# Patient Record
Sex: Female | Born: 1980 | Race: White | Hispanic: No | Marital: Married | Smoking: Never smoker
Health system: Southern US, Community
[De-identification: ages and names within clinical notes are randomized; demographics above are authoritative.]

## PROBLEM LIST (undated history)

## (undated) DIAGNOSIS — B977 Papillomavirus as the cause of diseases classified elsewhere: Secondary | ICD-10-CM

## (undated) DIAGNOSIS — B009 Herpesviral infection, unspecified: Secondary | ICD-10-CM

## (undated) DIAGNOSIS — F32A Depression, unspecified: Secondary | ICD-10-CM

## (undated) DIAGNOSIS — F419 Anxiety disorder, unspecified: Secondary | ICD-10-CM

## (undated) DIAGNOSIS — F329 Major depressive disorder, single episode, unspecified: Secondary | ICD-10-CM

## (undated) DIAGNOSIS — R87619 Unspecified abnormal cytological findings in specimens from cervix uteri: Secondary | ICD-10-CM

## (undated) HISTORY — DX: Unspecified abnormal cytological findings in specimens from cervix uteri: R87.619

## (undated) HISTORY — DX: Herpesviral infection, unspecified: B00.9

## (undated) HISTORY — DX: Papillomavirus as the cause of diseases classified elsewhere: B97.7

## (undated) HISTORY — PX: KNEE SURGERY: SHX244

## (undated) HISTORY — DX: Anxiety disorder, unspecified: F41.9

## (undated) HISTORY — PX: CERVICAL BIOPSY  W/ LOOP ELECTRODE EXCISION: SUR135

## (undated) HISTORY — DX: Major depressive disorder, single episode, unspecified: F32.9

## (undated) HISTORY — DX: Depression, unspecified: F32.A

---

## 2005-06-20 ENCOUNTER — Emergency Department: Payer: Self-pay | Admitting: Emergency Medicine

## 2011-07-26 ENCOUNTER — Emergency Department: Payer: Self-pay | Admitting: Emergency Medicine

## 2016-10-02 ENCOUNTER — Ambulatory Visit (INDEPENDENT_AMBULATORY_CARE_PROVIDER_SITE_OTHER): Payer: BC Managed Care – PPO | Admitting: Obstetrics and Gynecology

## 2016-10-02 ENCOUNTER — Encounter: Payer: Self-pay | Admitting: Obstetrics and Gynecology

## 2016-10-02 VITALS — BP 138/80 | HR 100 | Ht 68.0 in | Wt 168.8 lb

## 2016-10-02 DIAGNOSIS — Z01419 Encounter for gynecological examination (general) (routine) without abnormal findings: Secondary | ICD-10-CM | POA: Diagnosis not present

## 2016-10-02 DIAGNOSIS — Z87898 Personal history of other specified conditions: Secondary | ICD-10-CM

## 2016-10-02 DIAGNOSIS — E663 Overweight: Secondary | ICD-10-CM | POA: Diagnosis not present

## 2016-10-02 DIAGNOSIS — Z8742 Personal history of other diseases of the female genital tract: Secondary | ICD-10-CM

## 2016-10-02 NOTE — Patient Instructions (Signed)
Health Maintenance, Female Adopting a healthy lifestyle and getting preventive care can go a long way to promote health and wellness. Talk with your health care provider about what schedule of regular examinations is right for you. This is a good chance for you to check in with your provider about disease prevention and staying healthy. In between checkups, there are plenty of things you can do on your own. Experts have done a lot of research about which lifestyle changes and preventive measures are most likely to keep you healthy. Ask your health care provider for more information. Weight and diet Eat a healthy diet  Be sure to include plenty of vegetables, fruits, low-fat dairy products, and lean protein.  Do not eat a lot of foods high in solid fats, added sugars, or salt.  Get regular exercise. This is one of the most important things you can do for your health.  Most adults should exercise for at least 150 minutes each week. The exercise should increase your heart rate and make you sweat (moderate-intensity exercise).  Most adults should also do strengthening exercises at least twice a week. This is in addition to the moderate-intensity exercise. Maintain a healthy weight  Body mass index (BMI) is a measurement that can be used to identify possible weight problems. It estimates body fat based on height and weight. Your health care provider can help determine your BMI and help you achieve or maintain a healthy weight.  For females 35 years of age and older:  A BMI below 18.5 is considered underweight.  A BMI of 18.5 to 24.9 is normal.  A BMI of 25 to 29.9 is considered overweight.  A BMI of 30 and above is considered obese. Watch levels of cholesterol and blood lipids  You should start having your blood tested for lipids and cholesterol at 35 years of age, then have this test every 5 years.  You may need to have your cholesterol levels checked more often if:  Your lipid or  cholesterol levels are high.  You are older than 35 years of age.  You are at high risk for heart disease. Cancer screening Lung Cancer  Lung cancer screening is recommended for adults 64-42 years old who are at high risk for lung cancer because of a history of smoking.  A yearly low-dose CT scan of the lungs is recommended for people who:  Currently smoke.  Have quit within the past 15 years.  Have at least a 30-pack-year history of smoking. A pack year is smoking an average of one pack of cigarettes a day for 1 year.  Yearly screening should continue until it has been 15 years since you quit.  Yearly screening should stop if you develop a health problem that would prevent you from having lung cancer treatment. Breast Cancer  Practice breast self-awareness. This means understanding how your breasts normally appear and feel.  It also means doing regular breast self-exams. Let your health care provider know about any changes, no matter how small.  If you are in your 20s or 30s, you should have a clinical breast exam (CBE) by a health care provider every 1-3 years as part of a regular health exam.  If you are 34 or older, have a CBE every year. Also consider having a breast X-ray (mammogram) every year.  If you have a family history of breast cancer, talk to your health care provider about genetic screening.  If you are at high risk for breast cancer, talk  to your health care provider about having an MRI and a mammogram every year.  Breast cancer gene (BRCA) assessment is recommended for women who have family members with BRCA-related cancers. BRCA-related cancers include:  Breast.  Ovarian.  Tubal.  Peritoneal cancers.  Results of the assessment will determine the need for genetic counseling and BRCA1 and BRCA2 testing. Cervical Cancer  Your health care provider may recommend that you be screened regularly for cancer of the pelvic organs (ovaries, uterus, and vagina).  This screening involves a pelvic examination, including checking for microscopic changes to the surface of your cervix (Pap test). You may be encouraged to have this screening done every 3 years, beginning at age 24.  For women ages 66-65, health care providers may recommend pelvic exams and Pap testing every 3 years, or they may recommend the Pap and pelvic exam, combined with testing for human papilloma virus (HPV), every 5 years. Some types of HPV increase your risk of cervical cancer. Testing for HPV may also be done on women of any age with unclear Pap test results.  Other health care providers may not recommend any screening for nonpregnant women who are considered low risk for pelvic cancer and who do not have symptoms. Ask your health care provider if a screening pelvic exam is right for you.  If you have had past treatment for cervical cancer or a condition that could lead to cancer, you need Pap tests and screening for cancer for at least 20 years after your treatment. If Pap tests have been discontinued, your risk factors (such as having a new sexual partner) need to be reassessed to determine if screening should resume. Some women have medical problems that increase the chance of getting cervical cancer. In these cases, your health care provider may recommend more frequent screening and Pap tests. Colorectal Cancer  This type of cancer can be detected and often prevented.  Routine colorectal cancer screening usually begins at 35 years of age and continues through 35 years of age.  Your health care provider may recommend screening at an earlier age if you have risk factors for colon cancer.  Your health care provider may also recommend using home test kits to check for hidden blood in the stool.  A small camera at the end of a tube can be used to examine your colon directly (sigmoidoscopy or colonoscopy). This is done to check for the earliest forms of colorectal cancer.  Routine  screening usually begins at age 41.  Direct examination of the colon should be repeated every 5-10 years through 35 years of age. However, you may need to be screened more often if early forms of precancerous polyps or small growths are found. Skin Cancer  Check your skin from head to toe regularly.  Tell your health care provider about any new moles or changes in moles, especially if there is a change in a mole's shape or color.  Also tell your health care provider if you have a mole that is larger than the size of a pencil eraser.  Always use sunscreen. Apply sunscreen liberally and repeatedly throughout the day.  Protect yourself by wearing long sleeves, pants, a wide-brimmed hat, and sunglasses whenever you are outside. Heart disease, diabetes, and high blood pressure  High blood pressure causes heart disease and increases the risk of stroke. High blood pressure is more likely to develop in:  People who have blood pressure in the high end of the normal range (130-139/85-89 mm Hg).  People who are overweight or obese.  People who are African American.  If you are 59-24 years of age, have your blood pressure checked every 3-5 years. If you are 34 years of age or older, have your blood pressure checked every year. You should have your blood pressure measured twice-once when you are at a hospital or clinic, and once when you are not at a hospital or clinic. Record the average of the two measurements. To check your blood pressure when you are not at a hospital or clinic, you can use:  An automated blood pressure machine at a pharmacy.  A home blood pressure monitor.  If you are between 29 years and 60 years old, ask your health care provider if you should take aspirin to prevent strokes.  Have regular diabetes screenings. This involves taking a blood sample to check your fasting blood sugar level.  If you are at a normal weight and have a low risk for diabetes, have this test once  every three years after 35 years of age.  If you are overweight and have a high risk for diabetes, consider being tested at a younger age or more often. Preventing infection Hepatitis B  If you have a higher risk for hepatitis B, you should be screened for this virus. You are considered at high risk for hepatitis B if:  You were born in a country where hepatitis B is common. Ask your health care provider which countries are considered high risk.  Your parents were born in a high-risk country, and you have not been immunized against hepatitis B (hepatitis B vaccine).  You have HIV or AIDS.  You use needles to inject street drugs.  You live with someone who has hepatitis B.  You have had sex with someone who has hepatitis B.  You get hemodialysis treatment.  You take certain medicines for conditions, including cancer, organ transplantation, and autoimmune conditions. Hepatitis C  Blood testing is recommended for:  Everyone born from [birth year] through 1964.  Anyone with known risk factors for hepatitis C. Sexually transmitted infections (STIs)  You should be screened for sexually transmitted infections (STIs) including gonorrhea and chlamydia if:  You are sexually active and are younger than 35 years of age.  You are older than 35 years of age and your health care provider tells you that you are at risk for this type of infection.  Your sexual activity has changed since you were last screened and you are at an increased risk for chlamydia or gonorrhea. Ask your health care provider if you are at risk.  If you do not have HIV, but are at risk, it may be recommended that you take a prescription medicine daily to prevent HIV infection. This is called pre-exposure prophylaxis (PrEP). You are considered at risk if:  You are sexually active and do not regularly use condoms or know the HIV status of your partner(s).  You take drugs by injection.  You are sexually active with a partner  who has HIV. Talk with your health care provider about whether you are at high risk of being infected with HIV. If you choose to begin PrEP, you should first be tested for HIV. You should then be tested every 3 months for as long as you are taking PrEP. Pregnancy  If you are premenopausal and you may become pregnant, ask your health care provider about preconception counseling.  If you may become pregnant, take 400 to 800 micrograms (mcg) of folic acid  every day.  If you want to prevent pregnancy, talk to your health care provider about birth control (contraception). Osteoporosis and menopause  Osteoporosis is a disease in which the bones lose minerals and strength with aging. This can result in serious bone fractures. Your risk for osteoporosis can be identified using a bone density scan.  If you are 4 years of age or older, or if you are at risk for osteoporosis and fractures, ask your health care provider if you should be screened.  Ask your health care provider whether you should take a calcium or vitamin D supplement to lower your risk for osteoporosis.  Menopause may have certain physical symptoms and risks.  Hormone replacement therapy may reduce some of these symptoms and risks. Talk to your health care provider about whether hormone replacement therapy is right for you. Follow these instructions at home:  Schedule regular health, dental, and eye exams.  Stay current with your immunizations.  Do not use any tobacco products including cigarettes, chewing tobacco, or electronic cigarettes.  If you are pregnant, do not drink alcohol.  If you are breastfeeding, limit how much and how often you drink alcohol.  Limit alcohol intake to no more than 1 drink per day for nonpregnant women. One drink equals 12 ounces of beer, 5 ounces of wine, or 1 ounces of hard liquor.  Do not use street drugs.  Do not share needles.  Ask your health care provider for help if you need support  or information about quitting drugs.  Tell your health care provider if you often feel depressed.  Tell your health care provider if you have ever been abused or do not feel safe at home. This information is not intended to replace advice given to you by your health care provider. Make sure you discuss any questions you have with your health care provider. Document Released: 01/27/2011 Document Revised: 12/20/2015 Document Reviewed: 04/17/2015 Elsevier Interactive Patient Education  2017 [employer].
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
## 2016-10-03 LAB — COMPREHENSIVE METABOLIC PANEL
ALBUMIN: 4.3 g/dL (ref 3.5–5.5)
ALT: 15 IU/L (ref 0–32)
AST: 14 IU/L (ref 0–40)
Albumin/Globulin Ratio: 2 (ref 1.2–2.2)
Alkaline Phosphatase: 40 IU/L (ref 39–117)
BUN/Creatinine Ratio: 21 (ref 9–23)
BUN: 15 mg/dL (ref 6–20)
Bilirubin Total: 0.3 mg/dL (ref 0.0–1.2)
CALCIUM: 9.1 mg/dL (ref 8.7–10.2)
CO2: 24 mmol/L (ref 18–29)
CREATININE: 0.7 mg/dL (ref 0.57–1.00)
Chloride: 104 mmol/L (ref 96–106)
GFR, EST AFRICAN AMERICAN: 131 mL/min/{1.73_m2} (ref >59)
GFR, EST NON AFRICAN AMERICAN: 113 mL/min/{1.73_m2} (ref >59)
GLOBULIN, TOTAL: 2.2 g/dL (ref 1.5–4.5)
Glucose: 108 mg/dL — ABNORMAL HIGH (ref 65–99)
Potassium: 3.9 mmol/L (ref 3.5–5.2)
SODIUM: 140 mmol/L (ref 134–144)
TOTAL PROTEIN: 6.5 g/dL (ref 6.0–8.5)

## 2016-10-03 LAB — CBC
HEMATOCRIT: 42.1 % (ref 34.0–46.6)
HEMOGLOBIN: 14.2 g/dL (ref 11.1–15.9)
MCH: 30.7 pg (ref 26.6–33.0)
MCHC: 33.7 g/dL (ref 31.5–35.7)
MCV: 91 fL (ref 79–97)
Platelets: 247 10*3/uL (ref 150–379)
RBC: 4.62 x10E6/uL (ref 3.77–5.28)
RDW: 12.6 % (ref 12.3–15.4)
WBC: 8.5 10*3/uL (ref 3.4–10.8)

## 2016-10-06 LAB — PAP IG AND HPV HIGH-RISK
HPV, HIGH-RISK: NEGATIVE
PAP Smear Comment: 0

## 2017-10-06 ENCOUNTER — Ambulatory Visit (INDEPENDENT_AMBULATORY_CARE_PROVIDER_SITE_OTHER): Payer: BC Managed Care – PPO | Admitting: Obstetrics and Gynecology

## 2017-10-06 ENCOUNTER — Other Ambulatory Visit (INDEPENDENT_AMBULATORY_CARE_PROVIDER_SITE_OTHER): Payer: BC Managed Care – PPO

## 2017-10-06 ENCOUNTER — Encounter: Payer: Self-pay | Admitting: Obstetrics and Gynecology

## 2017-10-06 VITALS — BP 110/78 | HR 91 | Ht 68.0 in | Wt 179.9 lb

## 2017-10-06 DIAGNOSIS — N979 Female infertility, unspecified: Secondary | ICD-10-CM | POA: Diagnosis not present

## 2017-10-06 DIAGNOSIS — E663 Overweight: Secondary | ICD-10-CM | POA: Diagnosis not present

## 2017-10-06 DIAGNOSIS — Z01419 Encounter for gynecological examination (general) (routine) without abnormal findings: Secondary | ICD-10-CM | POA: Diagnosis not present

## 2017-10-06 NOTE — Patient Instructions (Addendum)
Health Maintenance, Female Adopting a healthy lifestyle and getting preventive care can go a long way to promote health and wellness. Talk with your health care provider about what schedule of regular examinations is right for you. This is a good chance for you to check in with your provider about disease prevention and staying healthy. In between checkups, there are plenty of things you can do on your own. Experts have done a lot of research about which lifestyle changes and preventive measures are most likely to keep you healthy. Ask your health care provider for more information. Weight and diet Eat a healthy diet  Be sure to include plenty of vegetables, fruits, low-fat dairy products, and lean protein.  Do not eat a lot of foods high in solid fats, added sugars, or salt.  Get regular exercise. This is one of the most important things you can do for your health. ? Most adults should exercise for at least 150 minutes each week. The exercise should increase your heart rate and make you sweat (moderate-intensity exercise). ? Most adults should also do strengthening exercises at least twice a week. This is in addition to the moderate-intensity exercise.  Maintain a healthy weight  Body mass index (BMI) is a measurement that can be used to identify possible weight problems. It estimates body fat based on height and weight. Your health care provider can help determine your BMI and help you achieve or maintain a healthy weight.  For females 20 years of age and older: ? A BMI below 18.5 is considered underweight. ? A BMI of 18.5 to 24.9 is normal. ? A BMI of 25 to 29.9 is considered overweight. ? A BMI of 30 and above is considered obese.  Watch levels of cholesterol and blood lipids  You should start having your blood tested for lipids and cholesterol at 36 years of age, then have this test every 5 years.  You may need to have your cholesterol levels checked more often if: ? Your lipid or  cholesterol levels are high. ? You are older than 36 years of age. ? You are at high risk for heart disease.  Cancer screening Lung Cancer  Lung cancer screening is recommended for adults 55-80 years old who are at high risk for lung cancer because of a history of smoking.  A yearly low-dose CT scan of the lungs is recommended for people who: ? Currently smoke. ? Have quit within the past 15 years. ? Have at least a 30-pack-year history of smoking. A pack year is smoking an average of one pack of cigarettes a day for 1 year.  Yearly screening should continue until it has been 15 years since you quit.  Yearly screening should stop if you develop a health problem that would prevent you from having lung cancer treatment.  Breast Cancer  Practice breast self-awareness. This means understanding how your breasts normally appear and feel.  It also means doing regular breast self-exams. Let your health care provider know about any changes, no matter how small.  If you are in your 20s or 30s, you should have a clinical breast exam (CBE) by a health care provider every 1-3 years as part of a regular health exam.  If you are 40 or older, have a CBE every year. Also consider having a breast X-ray (mammogram) every year.  If you have a family history of breast cancer, talk to your health care provider about genetic screening.  If you are at high risk   for breast cancer, talk to your health care provider about having an MRI and a mammogram every year.  Breast cancer gene (BRCA) assessment is recommended for women who have family members with BRCA-related cancers. BRCA-related cancers include: ? Breast. ? Ovarian. ? Tubal. ? Peritoneal cancers.  Results of the assessment will determine the need for genetic counseling and BRCA1 and BRCA2 testing.  Cervical Cancer Your health care provider may recommend that you be screened regularly for cancer of the pelvic organs (ovaries, uterus, and  vagina). This screening involves a pelvic examination, including checking for microscopic changes to the surface of your cervix (Pap test). You may be encouraged to have this screening done every 3 years, beginning at age 22.  For women ages 56-65, health care providers may recommend pelvic exams and Pap testing every 3 years, or they may recommend the Pap and pelvic exam, combined with testing for human papilloma virus (HPV), every 5 years. Some types of HPV increase your risk of cervical cancer. Testing for HPV may also be done on women of any age with unclear Pap test results.  Other health care providers may not recommend any screening for nonpregnant women who are considered low risk for pelvic cancer and who do not have symptoms. Ask your health care provider if a screening pelvic exam is right for you.  If you have had past treatment for cervical cancer or a condition that could lead to cancer, you need Pap tests and screening for cancer for at least 20 years after your treatment. If Pap tests have been discontinued, your risk factors (such as having a new sexual partner) need to be reassessed to determine if screening should resume. Some women have medical problems that increase the chance of getting cervical cancer. In these cases, your health care provider may recommend more frequent screening and Pap tests.  Colorectal Cancer  This type of cancer can be detected and often prevented.  Routine colorectal cancer screening usually begins at 36 years of age and continues through 36 years of age.  Your health care provider may recommend screening at an earlier age if you have risk factors for colon cancer.  Your health care provider may also recommend using home test kits to check for hidden blood in the stool.  A small camera at the end of a tube can be used to examine your colon directly (sigmoidoscopy or colonoscopy). This is done to check for the earliest forms of colorectal  cancer.  Routine screening usually begins at age 33.  Direct examination of the colon should be repeated every 5-10 years through 36 years of age. However, you may need to be screened more often if early forms of precancerous polyps or small growths are found.  Skin Cancer  Check your skin from head to toe regularly.  Tell your health care provider about any new moles or changes in moles, especially if there is a change in a mole's shape or color.  Also tell your health care provider if you have a mole that is larger than the size of a pencil eraser.  Always use sunscreen. Apply sunscreen liberally and repeatedly throughout the day.  Protect yourself by wearing long sleeves, pants, a wide-brimmed hat, and sunglasses whenever you are outside.  Heart disease, diabetes, and high blood pressure  High blood pressure causes heart disease and increases the risk of stroke. High blood pressure is more likely to develop in: ? People who have blood pressure in the high end of  the normal range (130-139/85-89 mm Hg). ? People who are overweight or obese. ? People who are African American.  If you are 21-29 years of age, have your blood pressure checked every 3-5 years. If you are 3 years of age or older, have your blood pressure checked every year. You should have your blood pressure measured twice-once when you are at a hospital or clinic, and once when you are not at a hospital or clinic. Record the average of the two measurements. To check your blood pressure when you are not at a hospital or clinic, you can use: ? An automated blood pressure machine at a pharmacy. ? A home blood pressure monitor.  If you are between 17 years and 37 years old, ask your health care provider if you should take aspirin to prevent strokes.  Have regular diabetes screenings. This involves taking a blood sample to check your fasting blood sugar level. ? If you are at a normal weight and have a low risk for diabetes,  have this test once every three years after 36 years of age. ? If you are overweight and have a high risk for diabetes, consider being tested at a younger age or more often. Preventing infection Hepatitis B  If you have a higher risk for hepatitis B, you should be screened for this virus. You are considered at high risk for hepatitis B if: ? You were born in a country where hepatitis B is common. Ask your health care provider which countries are considered high risk. ? Your parents were born in a high-risk country, and you have not been immunized against hepatitis B (hepatitis B vaccine). ? You have HIV or AIDS. ? You use needles to inject street drugs. ? You live with someone who has hepatitis B. ? You have had sex with someone who has hepatitis B. ? You get hemodialysis treatment. ? You take certain medicines for conditions, including cancer, organ transplantation, and autoimmune conditions.  Hepatitis C  Blood testing is recommended for: ? Everyone born from [birth year] through 1964. ? Anyone with known risk factors for hepatitis C.  Sexually transmitted infections (STIs)  You should be screened for sexually transmitted infections (STIs) including gonorrhea and chlamydia if: ? You are sexually active and are younger than 36 years of age. ? You are older than 36 years of age and your health care provider tells you that you are at risk for this type of infection. ? Your sexual activity has changed since you were last screened and you are at an increased risk for chlamydia or gonorrhea. Ask your health care provider if you are at risk.  If you do not have HIV, but are at risk, it may be recommended that you take a prescription medicine daily to prevent HIV infection. This is called pre-exposure prophylaxis (PrEP). You are considered at risk if: ? You are sexually active and do not regularly use condoms or know the HIV status of your partner(s). ? You take drugs by injection. ? You are  sexually active with a partner who has HIV.  Talk with your health care provider about whether you are at high risk of being infected with HIV. If you choose to begin PrEP, you should first be tested for HIV. You should then be tested every 3 months for as long as you are taking PrEP. Pregnancy  If you are premenopausal and you may become pregnant, ask your health care provider about preconception counseling.  If you may become  pregnant, take 400 to 800 micrograms (mcg) of folic acid every day.  If you want to prevent pregnancy, talk to your health care provider about birth control (contraception). Osteoporosis and menopause  Osteoporosis is a disease in which the bones lose minerals and strength with aging. This can result in serious bone fractures. Your risk for osteoporosis can be identified using a bone density scan.  If you are 60 years of age or older, or if you are at risk for osteoporosis and fractures, ask your health care provider if you should be screened.  Ask your health care provider whether you should take a calcium or vitamin D supplement to lower your risk for osteoporosis.  Menopause may have certain physical symptoms and risks.  Hormone replacement therapy may reduce some of these symptoms and risks. Talk to your health care provider about whether hormone replacement therapy is right for you. Follow these instructions at home:  Schedule regular health, dental, and eye exams.  Stay current with your immunizations.  Do not use any tobacco products including cigarettes, chewing tobacco, or electronic cigarettes.  If you are pregnant, do not drink alcohol.  If you are breastfeeding, limit how much and how often you drink alcohol.  Limit alcohol intake to no more than 1 drink per day for nonpregnant women. One drink equals 12 ounces of beer, 5 ounces of wine, or 1 ounces of hard liquor.  Do not use street drugs.  Do not share needles.  Ask your health care  provider for help if you need support or information about quitting drugs.  Tell your health care provider if you often feel depressed.  Tell your health care provider if you have ever been abused or do not feel safe at home. This information is not intended to replace advice given to you by your health care provider. Make sure you discuss any questions you have with your health care provider. Document Released: 01/27/2011 Document Revised: 12/20/2015 Document Reviewed: 04/17/2015 Elsevier Interactive Patient Education  [employer].    Infertility Infertility is when you are unable to get pregnant (conceive) after a year of having sex regularly without using birth control. Infertility can also mean that a woman is not able to carry a pregnancy to full term. Both women and men can have fertility problems. What causes infertility? What Causes Infertility in Women? There are many possible causes of infertility in women. For some women, the cause of infertility is not known (unexplained infertility). Infertility can also be linked to more than one cause. Infertility problems in women can be caused by problems with the menstrual cycle or reproductive organs, certain medical conditions, and factors related to lifestyle and age.  Problems with your menstrual cycle can interfere with your ovaries producing eggs (ovulation). This can make it difficult to get pregnant. This includes having a menstrual cycle that is very long, very short, or irregular.  Problems with reproductive organs can include: ? An abnormally narrow cervix or a cervix that does not remain closed during a pregnancy. ? A blockage in your fallopian tubes. ? An abnormally shaped uterus. ? Uterine fibroids. This is a tissue mass (tumor) that can develop on your uterus.  Medical conditions that can affect a woman's fertility include: ? Polycystic ovarian syndrome (PCOS). This is a hormonal disorder that can cause small cysts  to grow on your ovaries. This is the most common cause of infertility in women. ? Endometriosis. This is a condition in which the tissue that  lines your uterus (endometrium) grows outside of its normal location. ? Primary ovary insufficiency. This is when your ovaries stop producing eggs and hormones before the age of 32. ? Sexually transmitted diseases, such as chlamydia or gonorrhea. These infections can cause scarring in your fallopian tubes. This makes it difficult for eggs to reach your uterus. ? Autoimmune disorders. These are disorders in which your immune system attacks normal, healthy cells. ? Hormone imbalances.  Other factors include: ? Age. A woman's fertility declines with age, especially after her mid-82s. ? Being under- or overweight. ? Drinking too much alcohol. ? Using drugs. ? Exercising excessively. ? Being exposed to environmental toxins, such as radiation, pesticides, and certain chemicals.  What Causes Infertility in Men? There are many causes of infertility in men. Infertility can be linked to more than one cause. Infertility problems in men can be caused by problems with sperm or the reproductive organs, certain medical conditions, and factors related to lifestyle and age. Some men have unexplained infertility.  Problems with sperm. Infertility can result if there is a problem producing: ? Enough sperm (low sperm count). ? Enough normally-shaped sperm (sperm morphology). ? Sperm that are able to reach the egg (poor motility).  Infertility can also be caused by: ? A problem with hormones. ? Enlarged veins (varicoceles), cysts (spermatoceles), or tumors of the testicles. ? Sexual dysfunction. ? Injury to the testicles. ? A birth defect, such as not having the tubes that carry sperm (vas deferens).  Medical conditions that can affect a man's fertility include: ? Diabetes. ? Cancer treatments, such as chemotherapy or radiation. ? Klinefelter syndrome. This is an  inherited genetic disorder. ? Thyroid problems, such as an under- or overactive thyroid. ? Cystic fibrosis. ? Sexually transmitted diseases.  Other factors include: ? Age. A man's fertility declines with age. ? Drinking too much alcohol. ? Using drugs. ? Being exposed to environmental toxins, such as pesticides and lead.  What are the symptoms of infertility? Being unable to get pregnant after one year of having regular sex without using birth control is the only sign of infertility. How is infertility diagnosed? In order to be diagnosed with infertility, both partners will have a physical exam. Both partners will also have an extensive medical and sexual history taken. If there is no obvious reason for infertility, additional tests may be done. What Tests Will Women Have? Women may first have tests to check whether they are ovulating each month. The tests may include:  Blood tests to check hormone levels.  An ultrasound of the ovaries. This looks for possible problems on or in the ovaries.  Taking a small sample of the tissue that lines the uterus for examination under a microscope (endometrial biopsy).  Women who are ovulating may have additional tests. These may include:  Hysterosalpingography. ? This is an X-ray of the fallopian tubes and uterus taken after a specific type of dye is injected. ? This test can show the shape of the uterus and whether the fallopian tubes are open.  Laparoscopy. ? In this test, a lighted tube (laparoscope) is used to look for problems in the fallopian tubes and other female organs.  Transvaginal ultrasound. ? This is an imaging test to check for abnormalities of the uterus and ovaries. ? A health care provider can use this test to count the number of follicles on the ovaries.  Hysteroscopy. ? This test involves using a lighted tube to examine the cervix and inside the uterus. ? It  is done to find any abnormalities inside the uterus.  What  Tests Will Men Have? Tests for men's infertility includes:  Semen tests to check sperm count, morphology, and motility.  Blood tests to check for hormone levels.  Taking a small sample of tissue from inside a testicle (biopsy). This is examined under a microscope.  Blood tests to check for genetic abnormalities (genetic testing).  How are women treated for infertility? Treatment depends on the cause of infertility. Most cases of infertility in women are treated with medicine or surgery.  Women may take medicine to: ? Correct ovulation problems. ? Treat other health conditions, such as PCOS.  Surgery may be done to: ? Repair damage to the ovaries, fallopian tubes, cervix, or uterus. ? Remove growths from the uterus. ? Remove scar tissue from the uterus, pelvis, or other female organs.  How are men treated for infertility? Treatment depends on the cause of infertility. Most cases of infertility in men are treated with medicine or surgery.  Men may take medicine to: ? Correct hormone problems. ? Treat other health conditions. ? Treat sexual dysfunction.  Surgery may be done to: ? Remove blockages in the reproductive tract. ? Correct other structural problems of the reproductive tract.  What is assisted reproductive technology? Assisted reproductive technology (ART) refers to all treatments and procedures that combine eggs and sperm outside the body to try to help a couple conceive. ART is often combined with fertility drugs to stimulate ovulation. Sometimes ART is done using eggs retrieved from another woman's body (donor eggs) or from previously frozen fertilized eggs (embryos). There are different types of ART. These include:  Intrauterine insemination (IUI). ? In this procedure, sperm is placed directly into a woman's uterus with a long, thin tube. ? This may be most effective for infertility caused by sperm problems, including low sperm count and low motility. ? Can be used  in combination with fertility drugs.  In vitro fertilization (IVF). ? This is often done when a woman's fallopian tubes are blocked or when a man has low sperm counts. ? Fertility drugs stimulate the ovaries to produce multiple eggs. Once mature, these eggs are removed from the body and combined with the sperm to be fertilized. ? These fertilized eggs are then placed in the woman's uterus.  This information is not intended to replace advice given to you by your health care provider. Make sure you discuss any questions you have with your health care provider. Document Released: 07/17/2003 Document Revised: 12/14/2015 Document Reviewed: 03/29/2014 Elsevier Interactive Patient Education  2018 [employer].

## 2017-10-06 NOTE — Progress Notes (Signed)
Pt is doing well. Pt is trying to get pregnancy since April 2018.
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
## 2017-10-07 LAB — COMPREHENSIVE METABOLIC PANEL
ALT: 28 IU/L (ref 0–32)
AST: 19 IU/L (ref 0–40)
Albumin/Globulin Ratio: 2 (ref 1.2–2.2)
Albumin: 4.7 g/dL (ref 3.5–5.5)
Alkaline Phosphatase: 58 IU/L (ref 39–117)
BUN/Creatinine Ratio: 14 (ref 9–23)
BUN: 11 mg/dL (ref 6–20)
Bilirubin Total: 0.3 mg/dL (ref 0.0–1.2)
CALCIUM: 9.4 mg/dL (ref 8.7–10.2)
CO2: 19 mmol/L — ABNORMAL LOW (ref 20–29)
CREATININE: 0.78 mg/dL (ref 0.57–1.00)
Chloride: 102 mmol/L (ref 96–106)
GFR calc Af Amer: 114 mL/min/{1.73_m2} (ref >59)
GFR, EST NON AFRICAN AMERICAN: 99 mL/min/{1.73_m2} (ref >59)
GLOBULIN, TOTAL: 2.3 g/dL (ref 1.5–4.5)
GLUCOSE: 100 mg/dL — AB (ref 65–99)
Potassium: 4.5 mmol/L (ref 3.5–5.2)
SODIUM: 140 mmol/L (ref 134–144)
Total Protein: 7 g/dL (ref 6.0–8.5)

## 2017-10-07 LAB — CBC
HEMATOCRIT: 44.1 % (ref 34.0–46.6)
HEMOGLOBIN: 14.8 g/dL (ref 11.1–15.9)
MCH: 30.6 pg (ref 26.6–33.0)
MCHC: 33.6 g/dL (ref 31.5–35.7)
MCV: 91 fL (ref 79–97)
Platelets: 254 10*3/uL (ref 150–379)
RBC: 4.84 x10E6/uL (ref 3.77–5.28)
RDW: 13 % (ref 12.3–15.4)
WBC: 5.2 10*3/uL (ref 3.4–10.8)

## 2017-10-07 LAB — TSH: TSH: 2.36 u[IU]/mL (ref 0.450–4.500)

## 2017-10-10 LAB — PROGESTERONE: Progesterone: 8.4 ng/mL

## 2017-10-10 LAB — ESTRADIOL: ESTRADIOL: 140.5 pg/mL

## 2017-10-10 LAB — FSH/LH
FSH: 5 m[IU]/mL
LH: 10.9 m[IU]/mL

## 2017-10-10 LAB — ANTI MULLERIAN HORMONE: ANTI-MULLERIAN HORMONE (AMH): 4.08 ng/mL

## 2017-10-19 ENCOUNTER — Encounter: Payer: Self-pay | Admitting: Obstetrics and Gynecology

## 2017-10-19 ENCOUNTER — Ambulatory Visit (INDEPENDENT_AMBULATORY_CARE_PROVIDER_SITE_OTHER): Payer: BC Managed Care – PPO | Admitting: Obstetrics and Gynecology

## 2017-10-19 VITALS — BP 101/67 | HR 83 | Ht 68.0 in | Wt 181.8 lb

## 2017-10-19 DIAGNOSIS — N979 Female infertility, unspecified: Secondary | ICD-10-CM | POA: Diagnosis not present

## 2017-10-19 MED ORDER — LETROZOLE 2.5 MG PO TABS: 2.5000 mg | ORAL_TABLET | Freq: Every day | ORAL | 0 refills | Status: DC

## 2017-10-19 NOTE — Progress Notes (Signed)
GYNECOLOGY PROGRESS NOTE  Subjective:    Patient ID: Melanie Stevenson, female    DOB: [birth year], 37 y.o.   MRN: [id number]  HPI  Patient is a 37 y.o. G0P0000 female who presents for f/u of infertility evaluation.  Denies complaints today.  Patient's last menstrual period was 10/14/2017.   The following portions of the patient's history were reviewed and updated as appropriate: allergies, current medications, past family history, past medical history, past social history, past surgical history and problem list.  Review of Systems Pertinent items noted in HPI and remainder of comprehensive ROS otherwise negative.   Objective:   Blood pressure 101/67, pulse 83, height 5\' 8"  (1.727 m), weight 181 lb 12.8 oz (82.5 kg), last menstrual period 10/14/2017. General appearance: alert and no distress Remainder of exam deferred.    Labs:  Results for orders placed or performed in visit on 10/06/17  TSH  Result Value Ref Range   TSH 2.360 0.450 - 4.500 uIU/mL  Anti mullerian hormone  Result Value Ref Range   ANTI-MULLERIAN HORMONE (AMH) 4.08 ng/mL  Progesterone  Result Value Ref Range   Progesterone 8.4 ng/mL  Estradiol  Result Value Ref Range   Estradiol 140.5 pg/mL  FSH/LH  Result Value Ref Range   LH 10.9 mIU/mL   FSH 5.0 mIU/mL     Imaging:  ULTRASOUND REPORT  Location: [facility] Date of Service:  10/06/2017   Indications: Evaluate infertility Findings:  The uterus measures 9.5 x 5.9 x 5.0 cm. Echo texture is homogeneous without evidence of focal masses. The Endometrium measures 10.9 mm. A very small anechoic area is noted within the fundus of the endometrium.  ? Very early pregnancy vs fluid vs other etiology.  Right Ovary measures 2.9 x 2.4 x 1.5 cm. It is normal in appearance. Left Ovary measures 3.8 x 2.9 x 2.4 cm. It appears to contain a degenerating corpus luteal cyst. Survey of the adnexa demonstrates no adnexal masses. There is no free fluid  in the cul de sac.  Impression: 1. Anteverted uterus appears of normal size and contour. 2. The endometrium measures 10.9 mm. 3. Bilateral ovaries appear WNL. 4. Left ovary appears to contain a possible degenerating corpus luteal cyst. 5. Very small anechoic area noted within the fundus of the endometrium.  ? Very early pregnancy vs fluid vs other etiology.  Recommendations: 1.Clinical correlation with the patient's History and Physical Exam.   Kari Baars, RDMS   Assessment:   Infertility, primary  Plan:   - Discussion had regarding patient's labs and ultrasound. Possible early pregnancy ruled out as patient did go on to have a menstrual cycle this month.  Currently she has good ovarian reserve for her age, and normal estradiol and progesterone levels for current phase in cycle.  Ultrasound overall shows no structural causes for infertility (including septum, fibroids, or mullerian anomalies).  She has no prior history of pelvic infections or surgery, so HSG is not warranted at this time. Discussed use of ovulation induction medications to increase number of released eggs at ovulation time to increase chances of fertilization.   She was given a Rx for Femara 2.5 mg po qd to take on days 5-9 of her cycle.  The risks of Femara including ovarian hyperstimulation with possible risk of ovarian cancer as well as multiple gestation were discussed with patient.  Patient also advised to continue timed intercourse especially around Day 14 of her upcoming cycle (qod intercourse around days 9 - 18).  By Day 21 she can have a progesterone lab test.  If patient has bleeding at end of third cycle, will increase Femara by 2.5 mg for the following cycle; she can have a total of 6 cycles.  However if patient does not have bleeding, she was told to do a pregnancy test/come in for evaluation. - Also recommend partner evaluation with semen analysis. Given information and instructions to patient regarding  sample submission to give to partner.  - Will have patient f/u in 3 months for re-evaluation.    A total of 15 minutes were spent face-to-face with the patient during this encounter and over half of that time dealt with counseling and coordination of care.  Hildred Laserherry, Demetra Moya, MD [facility]

## 2017-10-19 NOTE — Patient Instructions (Signed)
FEMARA PATIENT INSTRUCTIONS  WHY USE IT? FEMARA helps your ovaries to release eggs (ovulate).  HOW TO USE IT? Femara is taken as a pill usually on days 5,6,7,8, & 9 of your cycle.  Day 1 is the first day of your period. The dose or duration may be changed to achieve ovulation.  Provera (progesterone) may first be used to bring on a period for some patients.  If you do not get pregnant this cycle, for your next cycles, take on days 1, 2, 3, 4 and 5.  If you do not get a period, take Provera 10 mg daily for 10 days to bring on a period; the first day you get bleeding is Day 1 of your cycle. The day of ovulation on Clomid is usually between cycle day 14 and 17.  Having sexual intercourse at least every other day between cycle day 13 and 18 will improve your chances of becoming pregnant during the Clomid cycle.  You may monitor your ovulation using basal body temperature charts or with ovulation kits.  If using the ovulation predictor kits, having intercourse the day of the surge and the two days following is recommended. If you get your period, call when it starts for an appointment with your doctor, so that an exam may be done, and another Clomid cycle can be considered if appropriate. If you do not get a period by day 35 of the cycle, please get a blood pregnancy test.  If it is negative, speak to your doctor for instructions to bring on another period and to plan a follow-up appointment.  THINGS TO KNOW: If you get pregnant while using Femara, your chance of twins is 7% and triplets is less than 1%. Some studies have suggested the use of "fertility drugs" may increase your risk of ovarian cancers in the future.  It is unclear if these drugs increase the risk, or people who have problems with fertility are prone for these cancers.  If there is an actual risk, it is very low.  If you have a history of liver problems or ovarian cancer, it may be wise to avoid this medication.  SIDE EFFECTS:  The most  common side effect is hot flashes (20%).  Breast tenderness, headaches, nausea, bloating may also occur at different times.  Less than 3/1,000 people have dryness or loss of hair.  Persistent ovarian cysts may form from the use of this medication.  Ovarian hyperstimulation syndrome is a rare side effect at low doses.  Visual changes like flashes of light or blurring.      Pregnancy After Age 36 Women who become pregnant after the age of 36 have a higher risk for certain problems during pregnancy. This is because older women may already have health problems before becoming pregnant. Older women who are healthy before pregnancy may still develop problems during pregnancy. These problems may affect the mother, the unborn baby (fetus), or both. What are the risks for me? If you are over age 36 and you want to become pregnant or are pregnant, you may have a higher risk of:  Not being able to get pregnant (infertility).  Going into labor early (preterm labor).  Needing surgical delivery of your baby (cesarean delivery, or C-section).  Having high blood pressure (hypertension).  Having complications during pregnancy, such as high blood pressure and other symptoms (preeclampsia).  Having diabetes during pregnancy (gestational diabetes).  Being pregnant with more than one baby.  Loss of the unborn baby before 6420  weeks (miscarriage) or after 20 weeks of pregnancy (stillbirth).  What are the risks for my baby? Babies born to women over the age of 4 have a higher risk for:  Being born early (prematurity).  Low birth weight, which is less than 5 lb, 8 oz (2.5 kg).  Birth defects, such as Down syndrome and cleft palate.  Health complications, including problems with growth and development.  How is prenatal care different for women over age 49? All women should see their health care provider before they try to become pregnant. This is especially important for women over the age of  75. Tell your health care provider about:  Any health problems you have.  Any medicines you take.  Any family history of health problems or chromosome-related defects.  Any problems you have had with past pregnancies or deliveries.  If you are over age 32 and you plan to become pregnant:  Start taking a daily multivitamin a month or more before you try to get pregnant. Your multivitamin should contain 400 mcg (micrograms) of folic acid.  If you are over age 17 and pregnant, make sure you:  Keep taking your multivitamin unless your health care provider tells you not to take it.  Keep all prenatal visits as told by your health care provider. This is important.  Have ultrasounds regularly throughout your pregnancy to check for problems.  Talk with your health care provider about other prenatal screening tests that you may need.  What additional prenatal tests are needed? Screening tests show whether your baby has a higher risk for birth defects than other babies. Screening tests include:  Ultrasound tests to look for markers that indicate a risk for birth defects.  Maternal blood screening. These are blood tests that measure certain substances in your blood to determine your baby's risk for defects.  Screening tests do not show whether your baby has or does not have defects. They only show your baby's risk for certain defects. If your screening tests show that risk factors are present, you may need tests to confirm the defect (diagnostic testing). These tests may include:  Chorionic villus sampling. For this procedure, a tissue sample is taken from the organ that forms in your uterus to nourish your baby (placenta). The sample is removed through your cervix or abdomen and tested.  Amniocentesis. For this procedure, a small amount of the fluid that surrounds the baby in the uterus (amniotic fluid) is removed and tested.  What can I do to stay healthy during my pregnancy? Staying  healthy during pregnancy can help you and your baby to have a lower risk for problems during pregnancy, during delivery, or both. Talk with your health care provider for specific instructions about staying healthy during your pregnancy. Nutrition  At each meal, eat a variety of foods from each of the five food groups. These groups include: ? Proteins such as lean meats, poultry, fish that is low in fat, beans, eggs, and nuts. ? Vegetables such as leafy greens, raw and cooked vegetables, and vegetable juice. ? Fruits that are fresh, frozen, or canned, or 100% fruit juice. ? Dairy products such as low-fat yogurt, cheese, and milk. ? Whole grains including rice, cereal, pasta, and bread.  Talk with your health care provider about how much food in each group is right for you.  Follow instructions from your health care provider about eating and drinking restrictions during pregnancy. ? Do not eat raw eggs, raw meat, or raw fish or seafood. ?  Do not eat any fish that contains high amounts of mercury, such as swordfish or mackerel.  Drink 6-8 or more glasses of water a day. You should drink enough fluid to keep your urine pale yellow. Managing weight gain  Ask your health care provider how much weight gain is healthy during pregnancy.  Stay at a healthy weight. If needed, work with your health care provider to lose weight safely. Activity  Exercise regularly, as directed by your health care provider. Ask your health care provider what forms of exercise are safe for you. General instructions  Do not use any products that contain nicotine or tobacco, such as cigarettes and e-cigarettes. If you need help quitting, ask your health care provider.  Do not drink alcohol, use drugs, or abuse prescription medicine.  Take over-the-counter and prescription medicines only as told by your health care provider.  Do not use hot tubs, steam rooms, or saunas.  Talk with your health care provider about  your risk of exposure to harmful environmental conditions. This includes exposure to chemicals, radiation, cleaning products, and cat feces. Follow advice from your health care provider about how to limit your exposure. Summary  Women who become pregnant after the age of 48 have a higher risk for complications during pregnancy.  Problems may affect the mother, the unborn baby (fetus), or both.  All women should see their health care provider before they try to become pregnant. This is especially important for women over the age of 31.  Staying healthy during pregnancy can help both you and your baby to have a lower risk for some of the problems that can happen during pregnancy, during delivery, or both. This information is not intended to replace advice given to you by your health care provider. Make sure you discuss any questions you have with your health care provider. Document Released: 11/03/2016 Document Revised: 11/03/2016 Document Reviewed: 11/03/2016 Elsevier Interactive Patient Education  2018 [employer].

## 2017-10-19 NOTE — Progress Notes (Signed)
Pt is doing well.

## 2017-10-21 ENCOUNTER — Encounter: Payer: BC Managed Care – PPO | Admitting: Obstetrics and Gynecology

## 2017-10-22 ENCOUNTER — Encounter: Payer: Self-pay | Admitting: Obstetrics and Gynecology

## 2017-11-04 ENCOUNTER — Other Ambulatory Visit: Payer: BC Managed Care – PPO

## 2017-11-04 ENCOUNTER — Other Ambulatory Visit: Payer: Self-pay | Admitting: Obstetrics and Gynecology

## 2017-11-04 ENCOUNTER — Other Ambulatory Visit: Payer: Self-pay

## 2017-11-04 ENCOUNTER — Encounter: Payer: Self-pay | Admitting: Obstetrics and Gynecology

## 2017-11-04 DIAGNOSIS — N979 Female infertility, unspecified: Secondary | ICD-10-CM

## 2017-11-05 LAB — PROGESTERONE: Progesterone: 10.3 ng/mL

## 2017-11-06 ENCOUNTER — Telehealth: Payer: Self-pay | Admitting: Obstetrics and Gynecology

## 2017-11-06 NOTE — Telephone Encounter (Signed)
Pt was called backed and informed of some of the information she requested but was unable to answer her question about her husband lab results. Pt was informed [facility] and to keep an eye out on her [facility] because [facility]. Pt was informed that CM would be calling her back to take to her more about the medication she is currently taking. Pt was really upset due to it had been 3 weeks since she had heard anything from anyone. Pt was assured that her not hearing anything back from someone  Was not intentionally.

## 2017-11-06 NOTE — Telephone Encounter (Signed)
The patient called and stated that she sent a message to Dr.Cherry 2 days ago and did not get a response, The patient was hoping to get a response or call before the weekend. The message sent via MyChart was:  "Hi Dr. Valentino Saxonherry,   Just two quick questions for you.   First, my [facility] March [facility]. We haven't received any results or follow up. Would that information be going to you, or somewhere else?   Second, if I need to do another month of the Femara, can you clarify for me if I should take it on days 5 - 9 as instructed for the first month, or on days 1 - 5? The instructions from the pharmacy are different from the after-visit instructions from your office.   Thank you!  ~Kayci "   Please advise.

## 2017-11-06 NOTE — Telephone Encounter (Signed)
Pt was called back by CM and went over test results.

## 2017-11-09 ENCOUNTER — Telehealth: Payer: Self-pay | Admitting: Obstetrics and Gynecology

## 2017-11-09 NOTE — Telephone Encounter (Signed)
Pt was called back and answered all questions that she had concerning her husband's test results. Pt was informed that CM spoke with an urologist and her husband's labs results were sent to a urologist to review. Pt was pleased to know that someone was helping her and her husband.

## 2017-11-09 NOTE — Telephone Encounter (Signed)
The patient called called and stated that she would like to speak with Dr. Valentino Saxonherry or her nurse in regards to her results that were just released to her, She would like clarification and more explanation. Please advise.

## 2018-01-14 ENCOUNTER — Other Ambulatory Visit: Payer: Self-pay | Admitting: Obstetrics and Gynecology

## 2018-01-14 ENCOUNTER — Encounter: Payer: BC Managed Care – PPO | Admitting: Obstetrics and Gynecology

## 2018-01-15 ENCOUNTER — Encounter: Payer: Self-pay | Admitting: Obstetrics and Gynecology

## 2018-01-15 ENCOUNTER — Ambulatory Visit: Payer: BC Managed Care – PPO | Admitting: Obstetrics and Gynecology

## 2018-01-15 VITALS — BP 108/75 | HR 96 | Ht 63.0 in | Wt 182.9 lb

## 2018-01-15 DIAGNOSIS — N469 Male infertility, unspecified: Secondary | ICD-10-CM

## 2018-01-15 DIAGNOSIS — Z3169 Encounter for other general counseling and advice on procreation: Secondary | ICD-10-CM

## 2018-01-15 NOTE — Progress Notes (Signed)
    GYNECOLOGY PROGRESS NOTE  Subjective:    Patient ID: Melanie Stevenson, female    DOB: 1981-03-13, 37 y.o.   MRN: [id number]  HPI  Patient is a 37 y.o. G0P0000 female who presents for f/u of fertlity. Patient and her husband underwent evaluation. The patient was with normal labs and ultrasound.  Her husband had a low sperm count/low motility.  Has been seen by a Urologist, who has discovered that patient's husband has a low testosterone level.  He is now taking Clomid, testosterone, and other supplements.  He is also scheduled to undergo a MRI to make sure his pituitary gland is normal.   The following portions of the patient's history were reviewed and updated as appropriate: allergies, current medications, past family history, past medical history, past social history, past surgical history and problem list.  Review of Systems A comprehensive review of systems was negative.   Objective:   Blood pressure 108/75, pulse 96, height 5\' 3"  (1.6 m), weight 182 lb 14.4 oz (83 kg), last menstrual period 01/04/2018. General appearance: alert and no distress Remainder of exam deferred.    Labs:   Results for Melanie SlateSAVITS, Melanie G (MRN [id number]) as of 01/15/2018 23:11  Ref. Range 10/06/2017 10:43  LH Latest Units: mIU/mL 10.9  FSH Latest Units: mIU/mL 5.0  ANTI-MULLERIAN HORMONE ([facility]) Latest Units: ng/mL 4.08  Estradiol Latest Units: pg/mL 140.5  Progesterone Latest Units: ng/mL 8.4    Results for orders placed or performed in visit on 11/04/17  Progesterone  Result Value Ref Range   Progesterone 10.3 ng/mL     US PELVIS TRANSVANGINAL NON-OB (TV ONLY) ULTRASOUND REPORT  Location: [facility] Date of Service:  10/06/2017  Indications: Evaluate infertility Findings:  The uterus measures 9.5 x 5.9 x 5.0 cm. Echo texture is homogeneous without evidence of focal masses. The Endometrium measures 10.9 mm. A very small anechoic area is noted within the fundus of the endometrium.     ? Very early pregnancy vs fluid vs other etiology.  Right Ovary measures 2.9 x 2.4 x 1.5 cm. It is normal in appearance. Left Ovary measures 3.8 x 2.9 x 2.4 cm. It appears to contain a  degenerating corpus luteal cyst. Survey of the adnexa demonstrates no adnexal masses. There is no free fluid in the cul de sac.  Impression: 1. Anteverted uterus appears of normal size and contour. 2. The endometrium measures 10.9 mm. 3. Bilateral ovaries appear WNL. 4. Left ovary appears to contain a possible degenerating corpus luteal  cyst. 5. Very small anechoic area noted within the fundus of the endometrium.  ?  Very early pregnancy vs fluid vs other etiology.  Recommendations: 1.Clinical correlation with the patient's History and Physical Exam.  Kari BaarsJill Long, RDMS  I have reviewed this study and agree with documented findings.   Hildred LaserAnika Dejean Tribby, MD [facility]   Assessment:   Infertility (female factor)  Plan:   Advised patient regarding last labs, Day #21 progesterone noting that patient is ovulating. Has regular cycles.  Pending her husband's repeat labs and testing, if levels return to normal with supplementation, patient should have no difficulties becoming pregnant.  Discussed timed coitus. If levels don't improve, patient may need to consider assisted reproduction (likely IUI, IVF).   Patient to f/u in 6 months if still no pregnancy has occurred.    Hildred Laserherry, Dehaven Sine, MD [facility]

## 2018-01-15 NOTE — Progress Notes (Signed)
Pt is present today to discuss fertility issues. No other complaints.

## 2018-08-06 ENCOUNTER — Ambulatory Visit: Payer: BC Managed Care – PPO | Admitting: Obstetrics and Gynecology

## 2018-08-06 ENCOUNTER — Encounter: Payer: Self-pay | Admitting: Obstetrics and Gynecology

## 2018-08-06 VITALS — BP 121/75 | HR 90 | Ht 67.0 in | Wt 160.7 lb

## 2018-08-06 DIAGNOSIS — N979 Female infertility, unspecified: Secondary | ICD-10-CM

## 2018-08-06 MED ORDER — LETROZOLE 2.5 MG PO TABS: 2.5000 mg | ORAL_TABLET | Freq: Every day | ORAL | 2 refills | Status: DC

## 2018-08-06 NOTE — Progress Notes (Signed)
    GYNECOLOGY PROGRESS NOTE  Subjective:    Patient ID: Melanie Stevenson, female    DOB: 21-Mar-1981, 38 y.o.   MRN: [id number]  HPI  Patient is a 38 y.o. G0P0000 female who presents for f/u of infertility.  Last time she was seen in June 2019, her husband had been seen by Urologist due to low testosterone levels and low sperm count/motility.  Notes that he has been treated for this, and levels have returned to normal, but still has not been able to conceive.  She did not continue the Femara at that time after 1 month of use as it was thought that the fertility issue had been identified and her workup had been negative). Notes cycles continue to remain regular. Patient's last menstrual period was 08/03/2018.   The following portions of the patient's history were reviewed and updated as appropriate: allergies, current medications, past family history, past medical history, past social history, past surgical history and problem list.  Review of Systems Pertinent items noted in HPI and remainder of comprehensive ROS otherwise negative.   Objective:   Blood pressure 121/75, pulse 90, height 5\' 7"  (1.702 m), weight 160 lb 11.2 oz (72.9 kg), last menstrual period 08/03/2018. General appearance: alert and no distress Remainder of exam deferred.    Assessment:   Infertility  Plan:   - Will re-initiate ovulation induction medication Femara to increase number of released eggs at ovulation time to increase chances of fertilization. She was given a Rx for Femara 2.5 mg po qd to take on days 5-9 of her cycle. The risks of Femara including ovarian hyperstimulation with possible risk of ovarian cancer as well as multiple gestation were discussed with patient. Patient also advised to continue timed intercourse especially around Day 14 of her upcoming cycle (qod intercourse around days 9 - 18). By Day 21 she can have a progesterone lab test. If patient has bleeding at end of third cycle, will increase  Femara by 2.5 mg for the following cycle; she can have a total of 6 cycles. However if patient does not have bleeding, she was told to do a pregnancy test/come in for evaluation.  Husband is to continue with his fertility treatments as well. Reiterated timed coitus.  - To f/u in 3 months for Day 21 lab testing and increase in medication dose.  - Patient may need to consider assisted reproduction (likely IUI, IVF) if no pregnancy after 6 months.    Hildred Laser, MD [facility]

## 2018-08-06 NOTE — Patient Instructions (Signed)
  FEMARA PATIENT INSTRUCTIONS  WHY USE IT? Femara helps your ovaries to release eggs (ovulate).  HOW TO USE IT? Femara is taken as a pill usually on days 5,6,7,8, & 9 of your cycle.  Day 1 is the first day of your period. The dose or duration may be changed to achieve ovulation.  Provera (progesterone) may first be used to bring on a period for some patients.  If you do not get pregnant this cycle, for your next cycles, take on days 1, 2, 3, 4 and 5.  If you do not get a period, take Provera 10 mg daily for 10 days to bring on a period; the first day you get bleeding is Day 1 of your cycle. The day of ovulation on Femara is usually between cycle day 14 and 17.  Having sexual intercourse at least every other day between cycle day 13 and 18 will improve your chances of becoming pregnant during the Clomid cycle.  You may monitor your ovulation using basal body temperature charts or with ovulation kits.  If using the ovulation predictor kits, having intercourse the day of the surge and the two days following is recommended. If you get your period, call when it starts for an appointment with your doctor, so that an exam may be done, and another Femara cycle can be considered if appropriate. If you do not get a period by day 35 of the cycle, please get a blood pregnancy test.  If it is negative, speak to your doctor for instructions to bring on another period and to plan a follow-up appointment.  THINGS TO KNOW: If you get pregnant while using Femara, your chance of twins is 7% and triplets is less than 1%. Some studies have suggested the use of "fertility drugs" may increase your risk of ovarian cancers in the future.  It is unclear if these drugs increase the risk, or people who have problems with fertility are prone for these cancers.  If there is an actual risk, it is very low.  If you have a history of liver problems or ovarian cancer, it may be wise to avoid this medication.  SIDE EFFECTS:  The most  common side effect is hot flashes (20%).  Breast tenderness, headaches, nausea, bloating may also occur at different times.  Less than 3/1,000 people have dryness or loss of hair.  Persistent ovarian cysts may form from the use of this medication.  Ovarian hyperstimulation syndrome is a rare side effect at low doses.  Visual changes like flashes of light or blurring.       

## 2018-08-06 NOTE — Progress Notes (Signed)
Pt is present today to discuss infertility issues.

## 2018-09-24 DIAGNOSIS — N469 Male infertility, unspecified: Secondary | ICD-10-CM

## 2018-10-01 ENCOUNTER — Telehealth: Payer: Self-pay

## 2018-10-01 NOTE — Telephone Encounter (Signed)
Pt states Dr. Lyndal Rainbow office called and needs all records faxed.   They received some but they want everything including labs.   Pls call pt when completed. Thanks

## 2018-10-01 NOTE — Telephone Encounter (Signed)
I spoke with Dr Lorenza Chick office. I resent records. They did not receive all the pages sent the first time.

## 2018-10-21 ENCOUNTER — Telehealth: Payer: Self-pay | Admitting: Obstetrics and Gynecology

## 2018-10-21 NOTE — Telephone Encounter (Signed)
The patient called and stated that she needs a prescription refill of the medication letrozole (FEMARA) 2.5 MG tablet, The patient also stated that she would like to switch her pharmacy to [facility] in [place] near [place] so the patient is able to pick up her prescriptions through the drive through. Please advise.

## 2018-10-22 MED ORDER — LETROZOLE 2.5 MG PO TABS: 2.5000 mg | ORAL_TABLET | Freq: Every day | ORAL | 2 refills | Status: DC

## 2018-10-22 NOTE — Telephone Encounter (Signed)
Completed.

## 2018-11-04 ENCOUNTER — Encounter: Payer: BC Managed Care – PPO | Admitting: Obstetrics and Gynecology

## 2018-12-29 LAB — OB RESULTS CONSOLE GC/CHLAMYDIA
Chlamydia: NEGATIVE
Gonorrhea: NEGATIVE

## 2018-12-29 LAB — OB RESULTS CONSOLE ABO/RH: RH Type: POSITIVE

## 2018-12-29 LAB — OB RESULTS CONSOLE ANTIBODY SCREEN: Antibody Screen: NEGATIVE

## 2018-12-29 LAB — OB RESULTS CONSOLE RUBELLA ANTIBODY, IGM: Rubella: IMMUNE

## 2018-12-29 LAB — OB RESULTS CONSOLE HEPATITIS B SURFACE ANTIGEN: Hepatitis B Surface Ag: NEGATIVE

## 2018-12-29 LAB — OB RESULTS CONSOLE RPR: RPR: NONREACTIVE

## 2018-12-29 LAB — OB RESULTS CONSOLE HIV ANTIBODY (ROUTINE TESTING): HIV: NONREACTIVE

## 2019-01-17 ENCOUNTER — Encounter: Payer: BC Managed Care – PPO | Admitting: Obstetrics and Gynecology

## 2019-01-19 ENCOUNTER — Encounter: Payer: BC Managed Care – PPO | Admitting: Obstetrics and Gynecology

## 2019-04-12 ENCOUNTER — Ambulatory Visit (INDEPENDENT_AMBULATORY_CARE_PROVIDER_SITE_OTHER): Payer: BC Managed Care – PPO | Admitting: Obstetrics and Gynecology

## 2019-04-12 ENCOUNTER — Telehealth: Payer: Self-pay | Admitting: Obstetrics and Gynecology

## 2019-04-12 ENCOUNTER — Other Ambulatory Visit: Payer: Self-pay

## 2019-04-12 DIAGNOSIS — Z3A09 9 weeks gestation of pregnancy: Secondary | ICD-10-CM

## 2019-04-12 DIAGNOSIS — Z789 Other specified health status: Secondary | ICD-10-CM

## 2019-04-12 NOTE — Telephone Encounter (Signed)
Spoke with pt and assisted her with making an appt to see [facility] on 04/20/19 at 2:45pm.

## 2019-04-12 NOTE — Telephone Encounter (Signed)
The patient was in the office for her appointment and while checking out the dispo stated that the patient should return around 10-12 days for new ob w/ [facility]. After reviewing schedule I informed the patient that we can get her in as early as 05/03/19. Pt was extremely upset and crying stating she is uncomfortable waiting that long due to her having bleeding and spotting. Spoke w/ nurse and was advised to inform pt we will call her back with approved opening. Please advise.

## 2019-04-12 NOTE — Progress Notes (Signed)
I have reviewed the record and concur with patient delivery summary as documented.   Rubie Maid, MD [facility]

## 2019-04-12 NOTE — Patient Instructions (Signed)
First Trimester of Pregnancy ° °The first trimester of pregnancy is from week 1 until the end of week 13 (months 1 through 3). During this time, your baby will begin to develop inside you. At 6-8 weeks, the eyes and face are formed, and the heartbeat can be seen on ultrasound. At the end of 12 weeks, all the baby's organs are formed. Prenatal care is all the medical care you receive before the birth of your baby. Make sure you get good prenatal care and follow all of your doctor's instructions. °Follow these instructions at home: °Medicines °· Take over-the-counter and prescription medicines only as told by your doctor. Some medicines are safe and some medicines are not safe during pregnancy. °· Take a prenatal vitamin that contains at least 600 micrograms (mcg) of folic acid. °· If you have trouble pooping (constipation), take medicine that will make your stool soft (stool softener) if your doctor approves. °Eating and drinking ° °· Eat regular, healthy meals. °· Your doctor will tell you the amount of weight gain that is right for you. °· Avoid raw meat and uncooked cheese. °· If you feel sick to your stomach (nauseous) or throw up (vomit): °? Eat 4 or 5 small meals a day instead of 3 large meals. °? Try eating a few soda crackers. °? Drink liquids between meals instead of during meals. °· To prevent constipation: °? Eat foods that are high in fiber, like fresh fruits and vegetables, whole grains, and beans. °? Drink enough fluids to keep your pee (urine) clear or pale yellow. °Activity °· Exercise only as told by your doctor. Stop exercising if you have cramps or pain in your lower belly (abdomen) or low back. °· Do not exercise if it is too hot, too humid, or if you are in a place of great height (high altitude). °· Try to avoid standing for long periods of time. Move your legs often if you must stand in one place for a long time. °· Avoid heavy lifting. °· Wear low-heeled shoes. Sit and stand up  straight. °· You can have sex unless your doctor tells you not to. °Relieving pain and discomfort °· Wear a good support bra if your breasts are sore. °· Take warm water baths (sitz baths) to soothe pain or discomfort caused by hemorrhoids. Use hemorrhoid cream if your doctor says it is okay. °· Rest with your legs raised if you have leg cramps or low back pain. °· If you have puffy, bulging veins (varicose veins) in your legs: °? Wear support hose or compression stockings as told by your doctor. °? Raise (elevate) your feet for 15 minutes, 3-4 times a day. °? Limit salt in your food. °Prenatal care °· Schedule your prenatal visits by the twelfth week of pregnancy. °· Write down your questions. Take them to your prenatal visits. °· Keep all your prenatal visits as told by your doctor. This is important. °Safety °· Wear your seat belt at all times when driving. °· Make a list of emergency phone numbers. The list should include numbers for family, friends, the hospital, and police and fire departments. °General instructions °· Ask your doctor for a referral to a local prenatal class. Begin classes no later than at the start of month 6 of your pregnancy. °· Ask for help if you need counseling or if you need help with nutrition. Your doctor can give you advice or tell you where to go for help. °· Do not use hot tubs, steam   rooms, or saunas. °· Do not douche or use tampons or scented sanitary pads. °· Do not cross your legs for long periods of time. °· Avoid all herbs and alcohol. Avoid drugs that are not approved by your doctor. °· Do not use any tobacco products, including cigarettes, chewing tobacco, and electronic cigarettes. If you need help quitting, ask your doctor. You may get counseling or other support to help you quit. °· Avoid cat litter boxes and soil used by cats. These carry germs that can cause birth defects in the baby and can cause a loss of your baby (miscarriage) or stillbirth. °· Visit your dentist.  At home, brush your teeth with a soft toothbrush. Be gentle when you floss. °Contact a doctor if: °· You are dizzy. °· You have mild cramps or pressure in your lower belly. °· You have a nagging pain in your belly area. °· You continue to feel sick to your stomach, you throw up, or you have watery poop (diarrhea). °· You have a bad smelling fluid coming from your vagina. °· You have pain when you pee (urinate). °· You have increased puffiness (swelling) in your face, hands, legs, or ankles. °Get help right away if: °· You have a fever. °· You are leaking fluid from your vagina. °· You have spotting or bleeding from your vagina. °· You have very bad belly cramping or pain. °· You gain or lose weight rapidly. °· You throw up blood. It may look like coffee grounds. °· You are around people who have German measles, fifth disease, or chickenpox. °· You have a very bad headache. °· You have shortness of breath. °· You have any kind of trauma, such as from a fall or a car accident. °Summary °· The first trimester of pregnancy is from week 1 until the end of week 13 (months 1 through 3). °· To take care of yourself and your unborn baby, you will need to eat healthy meals, take medicines only if your doctor tells you to do so, and do activities that are safe for you and your baby. °· Keep all follow-up visits as told by your doctor. This is important as your doctor will have to ensure that your baby is healthy and growing well. °This information is not intended to replace advice given to you by your health care provider. Make sure you discuss any questions you have with your health care provider. °Document Released: 12/31/2007 Document Revised: 11/04/2018 Document Reviewed: 07/22/2016 °Elsevier Patient Education © 2020 [employer] ° °

## 2019-04-12 NOTE — Progress Notes (Signed)
Norm Salt Delduca presents for NOB nurse interview visit. Pregnancy confirmation done [place] [facility]. This is a frozen embryo pregnancy.  G- 1.  P-    . Pregnancy education material explained and given. No_ cats in the home. NOB labs ordered. Marland Kitchen HIV labs and Drug screen were explained optional and she did not decline. Drug screen ordered/ PNV encouraged. Genetic screening options discussed. Genetic testing:/Unsure.  Pt may discuss with provider.  FMLA paperwork signed and financial policy discussed,pt voiced understanding.  She did state that she needs a "cervix check" when she is seen for her NOB PE.

## 2019-04-13 LAB — URINALYSIS, ROUTINE W REFLEX MICROSCOPIC
Bilirubin, UA: NEGATIVE
Glucose, UA: NEGATIVE
Ketones, UA: NEGATIVE
Leukocytes,UA: NEGATIVE
Nitrite, UA: NEGATIVE
Protein,UA: NEGATIVE
Specific Gravity, UA: 1.011 (ref 1.005–1.030)
Urobilinogen, Ur: 0.2 mg/dL (ref 0.2–1.0)
pH, UA: 6.5 (ref 5.0–7.5)

## 2019-04-13 LAB — MICROSCOPIC EXAMINATION
Casts: NONE SEEN /lpf
RBC, Urine: NONE SEEN /hpf (ref 0–2)

## 2019-04-13 LAB — VARICELLA ZOSTER ANTIBODY, IGG: Varicella zoster IgG: 1416 index (ref >165)

## 2019-04-14 LAB — URINE CULTURE

## 2019-04-20 ENCOUNTER — Encounter: Payer: Self-pay | Admitting: Obstetrics and Gynecology

## 2019-04-20 ENCOUNTER — Other Ambulatory Visit: Payer: Self-pay

## 2019-04-20 ENCOUNTER — Ambulatory Visit (INDEPENDENT_AMBULATORY_CARE_PROVIDER_SITE_OTHER): Payer: BC Managed Care – PPO | Admitting: Obstetrics and Gynecology

## 2019-04-20 VITALS — BP 118/79 | HR 121 | Ht 67.0 in | Wt 160.2 lb

## 2019-04-20 DIAGNOSIS — Z9889 Other specified postprocedural states: Secondary | ICD-10-CM

## 2019-04-20 DIAGNOSIS — O418X10 Other specified disorders of amniotic fluid and membranes, first trimester, not applicable or unspecified: Secondary | ICD-10-CM

## 2019-04-20 DIAGNOSIS — O468X1 Other antepartum hemorrhage, first trimester: Secondary | ICD-10-CM

## 2019-04-20 DIAGNOSIS — O3441 Maternal care for other abnormalities of cervix, first trimester: Secondary | ICD-10-CM

## 2019-04-20 DIAGNOSIS — O09521 Supervision of elderly multigravida, first trimester: Secondary | ICD-10-CM | POA: Diagnosis not present

## 2019-04-20 DIAGNOSIS — O09813 Supervision of pregnancy resulting from assisted reproductive technology, third trimester: Secondary | ICD-10-CM

## 2019-04-20 DIAGNOSIS — E8881 Metabolic syndrome: Secondary | ICD-10-CM

## 2019-04-20 DIAGNOSIS — Z8619 Personal history of other infectious and parasitic diseases: Secondary | ICD-10-CM

## 2019-04-20 LAB — POCT URINALYSIS DIPSTICK OB
Bilirubin, UA: NEGATIVE
Blood, UA: NEGATIVE
Glucose, UA: NEGATIVE
Ketones, UA: NEGATIVE
Leukocytes, UA: NEGATIVE
Nitrite, UA: NEGATIVE
POC,PROTEIN,UA: NEGATIVE
Spec Grav, UA: 1.01 (ref 1.010–1.025)
Urobilinogen, UA: 0.2 E.U./dL
pH, UA: 6 (ref 5.0–8.0)

## 2019-04-20 NOTE — Progress Notes (Signed)
Pt is present today for NOB PE. Pt stated noticing vaginal spotting brown blood off and on and woke up one night and was covered in blood.

## 2019-04-20 NOTE — Progress Notes (Signed)
OBSTETRIC INITIAL PRENATAL VISIT  Subjective:    Melanie Stevenson is being seen today for her first obstetrical visit.  This is a planned pregnancy, conceived with IVF ([facility]). She is a 38 y.o. G1P0000 female at [redacted]w[redacted]d gestation, Estimated Date of Delivery: 11/09/19 with last menstrual period 02/02/2019, consistent with 5 week sono. She has completed her hormone injections and estrogen, still on progesterone until [redacted] weeks gestation. 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 Relationship with FOB: spouse, living together. Patient does intend to breast feed. Pregnancy history fully reviewed.  Of note, patient reports several episodes of bleeding during early portion of pregnancy.  States that she was diagnosed with a small subchorionic hemorrhage.  She notes she is only noting spotting at this point.  Review of records also notes that she will need a cervical length ultrasound at 13-14 weeks as she was noted to possibly have a shortened cervix during her IVF procedure.  She does have a prior history of LEEP procedure. Lastly, she mentions that there was a question of whether or not she had PCOS based on mild insulin resistance and findings on ultrasound during her IVF procedure (although prior ultrasounds were normal).    OB History  Gravida Para Term Preterm AB Living  1 0 0 0 0 0  SAB TAB Ectopic Multiple Live Births  0 0 0 0 0    # Outcome Date GA Lbr Len/2nd Weight Sex Delivery Anes PTL Lv  1 Current             Gynecologic History:  Last pap smear was 10/02/2016.  Results were normal.  Notes a h/o abnormal pap smears in the past, s/p LEEP procedure in 2011.  Denies.  Contraception: None   Past Medical History:  Diagnosis Date  . Abnormal Pap smear of cervix   . Anxiety   . Depression   . HPV (human papilloma virus) infection   . HSV infection     Family History  Problem Relation Age of Onset  . Healthy  Mother   . Healthy Father   . Breast cancer Neg Hx   . Ovarian cancer Neg Hx   . Cervical cancer Neg Hx     Past Surgical History:  Procedure Laterality Date  . CERVICAL BIOPSY  W/ LOOP ELECTRODE EXCISION    . KNEE SURGERY Right     Social History   Socioeconomic History  . Marital status: Married    Spouse name: Not on file  . Number of children: Not on file  . Years of education: Not on file  . Highest education level: Not on file  Occupational History  . Not on file  Social Needs  . Financial resource strain: Not on file  . Food insecurity    Worry: Not on file    Inability: Not on file  . Transportation needs    Medical: Not on file    Non-medical: Not on file  Tobacco Use  . Smoking status: Never Smoker  . Smokeless tobacco: Never Used  Substance and Sexual Activity  . Alcohol use: Not Currently    Comment: Few beers a week   . Drug use: No  . Sexual activity: Yes    Birth control/protection: None  Lifestyle  . Physical activity    Days per week: Not on file    Minutes per session: Not on file  . Stress: Not on file  Relationships  .  Social Musician on phone: Not on file    Gets together: Not on file    Attends religious service: Not on file    Active member of club or organization: Not on file    Attends meetings of clubs or organizations: Not on file    Relationship status: Not on file  . Intimate partner violence    Fear of current or ex partner: Not on file    Emotionally abused: Not on file    Physically abused: Not on file    Forced sexual activity: Not on file  Other Topics Concern  . Not on file  Social History Narrative  . Not on file    Current Outpatient Medications on File Prior to Visit  Medication Sig Dispense Refill  . folic acid (FOLVITE) 1 MG tablet Take 1 mg by mouth daily.    Lucienne Minks Chiro-Inositol (OVASITOL PO) Take by mouth.    . loratadine (CLARITIN) 10 MG tablet Take 10 mg by mouth daily.    . Prenatal  Vit-Fe Fumarate-FA (MULTIVITAMIN-PRENATAL) 27-0.8 MG TABS tablet Take 1 tablet by mouth daily at 12 noon.    . Probiotic Product (PROBIOTIC COLON SUPPORT) CAPS Take by mouth.     No current facility-administered medications on file prior to visit.     Allergies  Allergen Reactions  . Codeine     Other reaction(s): Vomiting      Review of Systems General: Not Present- Fever, Weight Loss and Weight Gain. Skin: Not Present- Rash. HEENT: Not Present- Blurred Vision, Headache and Bleeding Gums. Respiratory: Not Present- Difficulty Breathing. Breast: Not Present- Breast Mass. Cardiovascular: Not Present- Chest Pain, Elevated Blood Pressure, Fainting / Blacking Out and Shortness of Breath. Gastrointestinal: Not Present- Abdominal Pain, Constipation, Nausea and Vomiting. Female Genitourinary: Not Present- Frequency, Painful Urination, Pelvic Pain, Vaginal Bleeding, Vaginal Discharge, Contractions, regular, Fetal Movements Decreased, Urinary Complaints and Vaginal Fluid. Musculoskeletal: Not Present- Back Pain and Leg Cramps. Neurological: Not Present- Dizziness. Psychiatric: Not Present- Depression.       Objective:   Blood pressure 118/79, pulse (!) 121, weight 160 lb 3.2 oz (72.7 kg), last menstrual period 02/02/2019.  Body mass index is 25.09 kg/m.  General Appearance:    Alert, cooperative, no distress, appears stated age  Head:    Normocephalic, without obvious abnormality, atraumatic  Eyes:    PERRL, conjunctiva/corneas clear, EOM's intact, both eyes  Ears:    Normal external ear canals, both ears  Nose:   Nares normal, septum midline, mucosa normal, no drainage or sinus tenderness  Throat:   Lips, mucosa, and tongue normal; teeth and gums normal  Neck:   Supple, symmetrical, trachea midline, no adenopathy; thyroid: no enlargement/tenderness/nodules; no carotid bruit or JVD  Back:     Symmetric, no curvature, ROM normal, no CVA tenderness  Lungs:     Clear to auscultation  bilaterally, respirations unlabored  Chest Wall:    No tenderness or deformity   Heart:    Regular rate and rhythm, S1 and S2 normal, no murmur, rub or gallop  Breast Exam:    No tenderness, masses, or nipple abnormality  Abdomen:     Soft, non-tender, bowel sounds active all four quadrants, no masses, no organomegaly.  FH 12.  FHT 168 bpm.  Genitalia:    external genitalia normal, vagina without lesions, discharge, or tenderness, rectovaginal septum  normal. Cervix normal in appearance, no cervical motion tenderness, no adnexal masses or tenderness.  Pregnancy positive findings: uterine  enlargement: 12 wk size, nontender.   Rectal:    Normal external sphincter.  No hemorrhoids appreciated. Internal exam not done.   Extremities:   Extremities normal, atraumatic, no cyanosis or edema  Pulses:   2+ and symmetric all extremities  Skin:   Skin color, texture, turgor normal, no rashes or lesions  Lymph nodes:   Cervical, supraclavicular, and axillary nodes normal  Neurologic:   CNII-XII intact, normal strength, sensation and reflexes throughout    Assessment:    Pregnancy at 11 and 0/7 weeks   Supervision of high risk elderly primigravida pregnancy in first trimester  Pregnancy resulting from in vitro fertilization in third trimester Subchorionic hematoma in first trimester, single or unspecified fetus  Insulin resistance History of loop electrosurgical excision procedure (LEEP) of cervix affecting pregnancy in first trimester History of HSV  Plan:    Initial labs reviewed  and prior records from [facility] entered into [facility]). Prenatal vitamins encouraged.  Problem list reviewed and updated. New OB counseling:  The patient has been given an overview regarding routine prenatal care.  Recommendations regarding diet, weight gain, and exercise in pregnancy were given. Prenatal testing, optional genetic testing, and ultrasound use in pregnancy were reviewed. Patient desires  genetic screening with cell-free DNA, Panaroma/Horizon done. Benefits of Breast Feeding were discussed. The patient is encouraged to consider nursing her baby post partum. 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 Will check A1c today, if elevated, patient will need early glucola.  Subchorionic hemorrhage per patient, still with occasional episodes of bleeding. Advised that it would likely resolve by second trimester. Fetal status reassuring today.  Will get ultrasound for cervical length measurement at 14 weeks.  History of HSV, will need prophylaxis at 36 weeks.  Follow up in 3-4 weeks.  50% of 30 min visit spent on counseling and coordination of care.     Hildred Laser, MD [facility]

## 2019-04-20 NOTE — Patient Instructions (Signed)
First Trimester of Pregnancy The first trimester of pregnancy is from week 1 until the end of week 13 (months 1 through 3). A week after a sperm fertilizes an egg, the egg will implant on the wall of the uterus. This embryo will begin to develop into a baby. Genes from you and your partner will form the baby. The female genes will determine whether the baby will be a boy or a girl. At 6-8 weeks, the eyes and face will be formed, and the heartbeat can be seen on ultrasound. At the end of 12 weeks, all the baby's organs will be formed. Now that you are pregnant, you will want to do everything you can to have a healthy baby. Two of the most important things are to get good prenatal care and to follow your health care provider's instructions. Prenatal care is all the medical care you receive before the baby's birth. This care will help prevent, find, and treat any problems during the pregnancy and childbirth. Body changes during your first trimester Your body goes through many changes during pregnancy. The changes vary from woman to woman.  You may gain or lose a couple of pounds at first.  You may feel sick to your stomach (nauseous) and you may throw up (vomit). If the vomiting is uncontrollable, call your health care provider.  You may tire easily.  You may develop headaches that can be relieved by medicines. All medicines should be approved by your health care provider.  You may urinate more often. Painful urination may mean you have a bladder infection.  You may develop heartburn as a result of your pregnancy.  You may develop constipation because certain hormones are causing the muscles that push stool through your intestines to slow down.  You may develop hemorrhoids or swollen veins (varicose veins).  Your breasts may begin to grow larger and become tender. Your nipples may stick out more, and the tissue that surrounds them (areola) may become darker.  Your gums may bleed and may be  sensitive to brushing and flossing.  Dark spots or blotches (chloasma, mask of pregnancy) may develop on your face. This will likely fade after the baby is born.  Your menstrual periods will stop.  You may have a loss of appetite.  You may develop cravings for certain kinds of food.  You may have changes in your emotions from day to day, such as being excited to be pregnant or being concerned that something may go wrong with the pregnancy and baby.  You may have more vivid and strange dreams.  You may have changes in your hair. These can include thickening of your hair, rapid growth, and changes in texture. Some women also have hair loss during or after pregnancy, or hair that feels dry or thin. Your hair will most likely return to normal after your baby is born. What to expect at prenatal visits During a routine prenatal visit:  You will be weighed to make sure you and the baby are growing normally.  Your blood pressure will be taken.  Your abdomen will be measured to track your baby's growth.  The fetal heartbeat will be listened to between weeks 10 and 14 of your pregnancy.  Test results from any previous visits will be discussed. Your health care provider may ask you:  How you are feeling.  If you are feeling the baby move.  If you have had any abnormal symptoms, such as leaking fluid, bleeding, severe headaches, or abdominal   cramping.  If you are using any tobacco products, including cigarettes, chewing tobacco, and electronic cigarettes.  If you have any questions. Other tests that may be performed during your first trimester include:  Blood tests to find your blood type and to check for the presence of any previous infections. The tests will also be used to check for low iron levels (anemia) and protein on red blood cells (Rh antibodies). Depending on your risk factors, or if you previously had diabetes during pregnancy, you may have tests to check for high blood sugar  that affects pregnant women (gestational diabetes).  Urine tests to check for infections, diabetes, or protein in the urine.  An ultrasound to confirm the proper growth and development of the baby.  Fetal screens for spinal cord problems (spina bifida) and Down syndrome.  HIV (human immunodeficiency virus) testing. Routine prenatal testing includes screening for HIV, unless you choose not to have this test.  You may need other tests to make sure you and the baby are doing well. Follow these instructions at home: Medicines  Follow your health care provider's instructions regarding medicine use. Specific medicines may be either safe or unsafe to take during pregnancy.  Take a prenatal vitamin that contains at least 600 micrograms (mcg) of folic acid.  If you develop constipation, try taking a stool softener if your health care provider approves. Eating and drinking   Eat a balanced diet that includes fresh fruits and vegetables, whole grains, good sources of protein such as meat, eggs, or tofu, and low-fat dairy. Your health care provider will help you determine the amount of weight gain that is right for you.  Avoid raw meat and uncooked cheese. These carry germs that can cause birth defects in the baby.  Eating four or five small meals rather than three large meals a day may help relieve nausea and vomiting. If you start to feel nauseous, eating a few soda crackers can be helpful. Drinking liquids between meals, instead of during meals, also seems to help ease nausea and vomiting.  Limit foods that are high in fat and processed sugars, such as fried and sweet foods.  To prevent constipation: ? Eat foods that are high in fiber, such as fresh fruits and vegetables, whole grains, and beans. ? Drink enough fluid to keep your urine clear or pale yellow. Activity  Exercise only as directed by your health care provider. Most women can continue their usual exercise routine during  pregnancy. Try to exercise for 30 minutes at least 5 days a week. Exercising will help you: ? Control your weight. ? Stay in shape. ? Be prepared for labor and delivery.  Experiencing pain or cramping in the lower abdomen or lower back is a good sign that you should stop exercising. Check with your health care provider before continuing with normal exercises.  Try to avoid standing for long periods of time. Move your legs often if you must stand in one place for a long time.  Avoid heavy lifting.  Wear low-heeled shoes and practice good posture.  You may continue to have sex unless your health care provider tells you not to. Relieving pain and discomfort  Wear a good support bra to relieve breast tenderness.  Take warm sitz baths to soothe any pain or discomfort caused by hemorrhoids. Use hemorrhoid cream if your health care provider approves.  Rest with your legs elevated if you have leg cramps or low back pain.  If you develop varicose veins in   your legs, wear support hose. Elevate your feet for 15 minutes, 3-4 times a day. Limit salt in your diet. Prenatal care  Schedule your prenatal visits by the twelfth week of pregnancy. They are usually scheduled monthly at first, then more often in the last 2 months before delivery.  Write down your questions. Take them to your prenatal visits.  Keep all your prenatal visits as told by your health care provider. This is important. Safety  Wear your seat belt at all times when driving.  Make a list of emergency phone numbers, including numbers for family, friends, the hospital, and police and fire departments. General instructions  Ask your health care provider for a referral to a local prenatal education class. Begin classes no later than the beginning of month 6 of your pregnancy.  Ask for help if you have counseling or nutritional needs during pregnancy. Your health care provider can offer advice or refer you to specialists for help  with various needs.  Do not use hot tubs, steam rooms, or saunas.  Do not douche or use tampons or scented sanitary pads.  Do not cross your legs for long periods of time.  Avoid cat litter boxes and soil used by cats. These carry germs that can cause birth defects in the baby and possibly loss of the fetus by miscarriage or stillbirth.  Avoid all smoking, herbs, alcohol, and medicines not prescribed by your health care provider. Chemicals in these products affect the formation and growth of the baby.  Do not use any products that contain nicotine or tobacco, such as cigarettes and e-cigarettes. If you need help quitting, ask your health care provider. You may receive counseling support and other resources to help you quit.  Schedule a dentist appointment. At home, brush your teeth with a soft toothbrush and be gentle when you floss. Contact a health care provider if:  You have dizziness.  You have mild pelvic cramps, pelvic pressure, or nagging pain in the abdominal area.  You have persistent nausea, vomiting, or diarrhea.  You have a bad smelling vaginal discharge.  You have pain when you urinate.  You notice increased swelling in your face, hands, legs, or ankles.  You are exposed to fifth disease or chickenpox.  You are exposed to German measles (rubella) and have never had it. Get help right away if:  You have a fever.  You are leaking fluid from your vagina.  You have spotting or bleeding from your vagina.  You have severe abdominal cramping or pain.  You have rapid weight gain or loss.  You vomit blood or material that looks like coffee grounds.  You develop a severe headache.  You have shortness of breath.  You have any kind of trauma, such as from a fall or a car accident. Summary  The first trimester of pregnancy is from week 1 until the end of week 13 (months 1 through 3).  Your body goes through many changes during pregnancy. The changes vary from  woman to woman.  You will have routine prenatal visits. During those visits, your health care provider will examine you, discuss any test results you may have, and talk with you about how you are feeling. This information is not intended to replace advice given to you by your health care provider. Make sure you discuss any questions you have with your health care provider. Document Released: 07/08/2001 Document Revised: 06/26/2017 Document Reviewed: 06/25/2016 Elsevier Patient Education  2020 [employer]  

## 2019-04-21 ENCOUNTER — Encounter: Payer: Self-pay | Admitting: Obstetrics and Gynecology

## 2019-04-21 LAB — CBC
Hematocrit: 45.4 % (ref 34.0–46.6)
Hemoglobin: 15.2 g/dL (ref 11.1–15.9)
MCH: 31.1 pg (ref 26.6–33.0)
MCHC: 33.5 g/dL (ref 31.5–35.7)
MCV: 93 fL (ref 79–97)
Platelets: 285 10*3/uL (ref 150–450)
RBC: 4.88 x10E6/uL (ref 3.77–5.28)
RDW: 11.9 % (ref 11.7–15.4)
WBC: 8.9 10*3/uL (ref 3.4–10.8)

## 2019-04-21 LAB — HEMOGLOBIN A1C
Est. average glucose Bld gHb Est-mCnc: 91 mg/dL
Hgb A1c MFr Bld: 4.8 % (ref 4.8–5.6)

## 2019-05-06 ENCOUNTER — Telehealth: Payer: Self-pay | Admitting: Obstetrics and Gynecology

## 2019-05-06 NOTE — Telephone Encounter (Signed)
The patient called again upset about not hearing anything back in regards to her results. The patient stated that she is extremely anxious and concerned that she has not received a response yet. Pt stated she spoke with the "lab company" and was informed "our office has had her results since this past Sunday". I informed the patient a nurse/provider will call her as soon as they possibly can. Pt spoke over me and interrupted me several times. Pt stated "If I don't hear anything back Im calling you again at 3". Please advise.

## 2019-05-06 NOTE — Telephone Encounter (Signed)
The patient called and stated that she had labs done 16 days ago and has yet to receive her results. Pt stated she sent a message and has not received a response and is also extremely concerned, Pt is requesting a call back as soon as possible. Please advise.

## 2019-05-06 NOTE — Telephone Encounter (Signed)
Pt was informed of test results.

## 2019-05-11 ENCOUNTER — Ambulatory Visit (INDEPENDENT_AMBULATORY_CARE_PROVIDER_SITE_OTHER): Payer: BC Managed Care – PPO

## 2019-05-11 ENCOUNTER — Encounter: Payer: BC Managed Care – PPO | Admitting: Obstetrics and Gynecology

## 2019-05-11 ENCOUNTER — Other Ambulatory Visit: Payer: Self-pay

## 2019-05-11 DIAGNOSIS — O468X1 Other antepartum hemorrhage, first trimester: Secondary | ICD-10-CM | POA: Diagnosis not present

## 2019-05-11 DIAGNOSIS — O3441 Maternal care for other abnormalities of cervix, first trimester: Secondary | ICD-10-CM

## 2019-05-11 DIAGNOSIS — O418X10 Other specified disorders of amniotic fluid and membranes, first trimester, not applicable or unspecified: Secondary | ICD-10-CM | POA: Diagnosis not present

## 2019-05-11 DIAGNOSIS — O09813 Supervision of pregnancy resulting from assisted reproductive technology, third trimester: Secondary | ICD-10-CM | POA: Diagnosis not present

## 2019-05-11 DIAGNOSIS — Z9889 Other specified postprocedural states: Secondary | ICD-10-CM

## 2019-05-12 NOTE — Progress Notes (Signed)
ROB-Pt is present for routine prenatal care. Pt stated that she was doing well no problems.  

## 2019-05-12 NOTE — Patient Instructions (Signed)
Second Trimester of Pregnancy  The second trimester is from week 14 through week 27 (month 4 through 6). This is often the time in pregnancy that you feel your best. Often times, morning sickness has lessened or quit. You may have more energy, and you may get hungry more often. Your unborn baby is growing rapidly. At the end of the sixth month, he or she is about 9 inches long and weighs about 1 pounds. You will likely feel the baby move between 18 and 20 weeks of pregnancy. Follow these instructions at home: Medicines  Take over-the-counter and prescription medicines only as told by your doctor. Some medicines are safe and some medicines are not safe during pregnancy.  Take a prenatal vitamin that contains at least 600 micrograms (mcg) of folic acid.  If you have trouble pooping (constipation), take medicine that will make your stool soft (stool softener) if your doctor approves. Eating and drinking   Eat regular, healthy meals.  Avoid raw meat and uncooked cheese.  If you get low calcium from the food you eat, talk to your doctor about taking a daily calcium supplement.  Avoid foods that are high in fat and sugars, such as fried and sweet foods.  If you feel sick to your stomach (nauseous) or throw up (vomit): ? Eat 4 or 5 small meals a day instead of 3 large meals. ? Try eating a few soda crackers. ? Drink liquids between meals instead of during meals.  To prevent constipation: ? Eat foods that are high in fiber, like fresh fruits and vegetables, whole grains, and beans. ? Drink enough fluids to keep your pee (urine) clear or pale yellow. Activity  Exercise only as told by your doctor. Stop exercising if you start to have cramps.  Do not exercise if it is too hot, too humid, or if you are in a place of great height (high altitude).  Avoid heavy lifting.  Wear low-heeled shoes. Sit and stand up straight.  You can continue to have sex unless your doctor tells you not to.  Relieving pain and discomfort  Wear a good support bra if your breasts are tender.  Take warm water baths (sitz baths) to soothe pain or discomfort caused by hemorrhoids. Use hemorrhoid cream if your doctor approves.  Rest with your legs raised if you have leg cramps or low back pain.  If you develop puffy, bulging veins (varicose veins) in your legs: ? Wear support hose or compression stockings as told by your doctor. ? Raise (elevate) your feet for 15 minutes, 3-4 times a day. ? Limit salt in your food. Prenatal care  Write down your questions. Take them to your prenatal visits.  Keep all your prenatal visits as told by your doctor. This is important. Safety  Wear your seat belt when driving.  Make a list of emergency phone numbers, including numbers for family, friends, the hospital, and police and fire departments. General instructions  Ask your doctor about the right foods to eat or for help finding a counselor, if you need these services.  Ask your doctor about local prenatal classes. Begin classes before month 6 of your pregnancy.  Do not use hot tubs, steam rooms, or saunas.  Do not douche or use tampons or scented sanitary pads.  Do not cross your legs for long periods of time.  Visit your dentist if you have not done so. Use a soft toothbrush to brush your teeth. Floss gently.  Avoid all smoking, herbs,  and alcohol. Avoid drugs that are not approved by your doctor.  Do not use any products that contain nicotine or tobacco, such as cigarettes and e-cigarettes. If you need help quitting, ask your doctor.  Avoid cat litter boxes and soil used by cats. These carry germs that can cause birth defects in the baby and can cause a loss of your baby (miscarriage) or stillbirth. Contact a doctor if:  You have mild cramps or pressure in your lower belly.  You have pain when you pee (urinate).  You have bad smelling fluid coming from your vagina.  You continue to feel  sick to your stomach (nauseous), throw up (vomit), or have watery poop (diarrhea).  You have a nagging pain in your belly area.  You feel dizzy. Get help right away if:  You have a fever.  You are leaking fluid from your vagina.  You have spotting or bleeding from your vagina.  You have severe belly cramping or pain.  You lose or gain weight rapidly.  You have trouble catching your breath and have chest pain.  You notice sudden or extreme puffiness (swelling) of your face, hands, ankles, feet, or legs.  You have not felt the baby move in over an hour.  You have severe headaches that do not go away when you take medicine.  You have trouble seeing. Summary  The second trimester is from week 14 through week 27 (months 4 through 6). This is often the time in pregnancy that you feel your best.  To take care of yourself and your unborn baby, you will need to eat healthy meals, take medicines only if your doctor tells you to do so, and do activities that are safe for you and your baby.  Call your doctor if you get sick or if you notice anything unusual about your pregnancy. Also, call your doctor if you need help with the right food to eat, or if you want to know what activities are safe for you. This information is not intended to replace advice given to you by your health care provider. Make sure you discuss any questions you have with your health care provider. Document Released: 10/08/2009 Document Revised: 11/05/2018 Document Reviewed: 08/19/2016 Elsevier Patient Education  2020 [employer] Common Medications Safe in Pregnancy  Acne:      Constipation:  Benzoyl Peroxide     Colace  Clindamycin      Dulcolax Suppository  Topica Erythromycin     Fibercon  Salicylic Acid      Metamucil         Miralax AVOID:        Senakot   Accutane    Cough:  Retin-A       Cough Drops  Tetracycline      Phenergan w/ Codeine if Rx  Minocycline      Robitussin (Plain & DM)   Antibiotics:     Crabs/Lice:  Ceclor       RID  Cephalosporins    AVOID:  E-Mycins      Kwell  Keflex  Macrobid/Macrodantin   Diarrhea:  Penicillin      Kao-Pectate  Zithromax      Imodium AD         PUSH FLUIDS AVOID:       Cipro     Fever:  Tetracycline      Tylenol (Regular or Extra  Minocycline       Strength)  Levaquin      Extra Strength-Do   not          Exceed 8 tabs/24 hrs Caffeine:        <200mg/day (equiv. To 1 cup of coffee or  approx. 3 12 oz sodas)         Gas: Cold/Hayfever:       Gas-X  Benadryl      Mylicon  Claritin       Phazyme  **Claritin-D        Chlor-Trimeton    Headaches:  Dimetapp      ASA-Free Excedrin  Drixoral-Non-Drowsy     Cold Compress  Mucinex (Guaifenasin)     Tylenol (Regular or Extra  Sudafed/Sudafed-12 Hour     Strength)  **Sudafed PE Pseudoephedrine   Tylenol Cold & Sinus     Vicks Vapor Rub  Zyrtec  **AVOID if Problems With Blood Pressure         Heartburn: Avoid lying down for at least 1 hour after meals  Aciphex      Maalox     Rash:  Milk of Magnesia     Benadryl    Mylanta       1% Hydrocortisone Cream  Pepcid  Pepcid Complete   Sleep Aids:  Prevacid      Ambien   Prilosec       Benadryl  Rolaids       Chamomile Tea  Tums (Limit 4/day)     Unisom  Zantac       Tylenol PM         Warm milk-add vanilla or  Hemorrhoids:       Sugar for taste  Anusol/Anusol H.C.  (RX: Analapram 2.5%)  Sugar Substitutes:  Hydrocortisone OTC     Ok in moderation  Preparation H      Tucks        Vaseline lotion applied to tissue with wiping    Herpes:     Throat:  Acyclovir      Oragel  Famvir  Valtrex     Vaccines:         Flu Shot Leg Cramps:       *Gardasil  Benadryl      Hepatitis A         Hepatitis B Nasal Spray:       Pneumovax  Saline Nasal Spray     Polio Booster         Tetanus Nausea:       Tuberculosis test or PPD  Vitamin B6 25 mg TID   AVOID:    Dramamine      *Gardasil  Emetrol       Live Poliovirus  Ginger  Root 250 mg QID    MMR (measles, mumps &  High Complex Carbs @ Bedtime    rebella)  Sea Bands-Accupressure    Varicella (Chickenpox)  Unisom 1/2 tab TID     *No known complications           If received before Pain:         Known pregnancy;   Darvocet       Resume series after  Lortab        Delivery  Percocet    Yeast:   Tramadol      Femstat  Tylenol 3      Gyne-lotrimin  Ultram       Monistat  Vicodin           MISC:         All Sunscreens             Hair Coloring/highlights          Insect Repellant's          (Including DEET)         Mystic Tans  

## 2019-05-13 ENCOUNTER — Other Ambulatory Visit: Payer: Self-pay

## 2019-05-13 ENCOUNTER — Ambulatory Visit (INDEPENDENT_AMBULATORY_CARE_PROVIDER_SITE_OTHER): Payer: BC Managed Care – PPO | Admitting: Obstetrics and Gynecology

## 2019-05-13 ENCOUNTER — Encounter: Payer: Self-pay | Admitting: Obstetrics and Gynecology

## 2019-05-13 VITALS — BP 118/76 | HR 114 | Wt 168.1 lb

## 2019-05-13 DIAGNOSIS — Z8742 Personal history of other diseases of the female genital tract: Secondary | ICD-10-CM | POA: Insufficient documentation

## 2019-05-13 DIAGNOSIS — O09522 Supervision of elderly multigravida, second trimester: Secondary | ICD-10-CM | POA: Insufficient documentation

## 2019-05-13 DIAGNOSIS — O3440 Maternal care for other abnormalities of cervix, unspecified trimester: Secondary | ICD-10-CM | POA: Insufficient documentation

## 2019-05-13 DIAGNOSIS — O09812 Supervision of pregnancy resulting from assisted reproductive technology, second trimester: Secondary | ICD-10-CM

## 2019-05-13 DIAGNOSIS — O3442 Maternal care for other abnormalities of cervix, second trimester: Secondary | ICD-10-CM

## 2019-05-13 DIAGNOSIS — Z9889 Other specified postprocedural states: Secondary | ICD-10-CM | POA: Insufficient documentation

## 2019-05-13 DIAGNOSIS — Z23 Encounter for immunization: Secondary | ICD-10-CM

## 2019-05-13 DIAGNOSIS — O09819 Supervision of pregnancy resulting from assisted reproductive technology, unspecified trimester: Secondary | ICD-10-CM | POA: Insufficient documentation

## 2019-05-13 LAB — POCT URINALYSIS DIPSTICK OB
Bilirubin, UA: NEGATIVE
Blood, UA: NEGATIVE
Glucose, UA: NEGATIVE
Ketones, UA: NEGATIVE
Leukocytes, UA: NEGATIVE
Nitrite, UA: NEGATIVE
POC,PROTEIN,UA: NEGATIVE
Spec Grav, UA: >=1.03 — AB (ref 1.010–1.025)
Urobilinogen, UA: 0.2 E.U./dL
pH, UA: 6 (ref 5.0–8.0)

## 2019-05-13 MED ORDER — ASPIRIN EC 81 MG PO TBEC: 81.0000 mg | DELAYED_RELEASE_TABLET | Freq: Every day | ORAL | 2 refills | Status: DC

## 2019-05-13 NOTE — Progress Notes (Signed)
ROB: Patient doing well, no complaints.  Notes bleeding has almost stopped (had subchorionic hemorrhage in early pregnancy).  Had cervical length yesterday due to h/o LEEP and recommendations from [facility] physician due to suspected shortening of cervix on initial exam. Was normal, 4.5 cm. Will repeat at 20 weeks with anatomy scan. If remains normal, no further surveillance needed.Needs to begin daily baby aspirin Flu vaccine given today. RTC in 4 weeks.

## 2019-05-17 NOTE — Telephone Encounter (Signed)
Contacted patient regarding her concerns on starting baby aspirin for prevention of pre-eclampsia due to risk factors (AMA status, nulliparity, IVF pregnancy) as she has been bleeding during her first trimester and is just now getting to the point of not having any bleeding.  Advised that she can wait until 16 weeks to ensure no further bleeding.  Also discussed recent ultrasound in more detail at patient's request.  Patient given reassurance, is overall thankful for the call.  Notes she will begin the aspirin at [redacted] weeks gestation.     Rubie Maid, MD [facility]

## 2019-05-27 ENCOUNTER — Telehealth: Payer: Self-pay | Admitting: Obstetrics and Gynecology

## 2019-05-27 NOTE — Telephone Encounter (Signed)
Pt stated that she noticed that she was dehydrated and increased her fluid intakes yesterday. Pt stated today that she noticed that she has been urinating a lot more than usually. Pt denies any back pain, dark urine or odor with urine. Pt was advised that if she noticed issues like her last UTI to please contact the office and to continue fluids and staying hydrated.

## 2019-05-27 NOTE — Telephone Encounter (Signed)
The patient called and requested a call back from Dr. Marcelline Mates or her nurse today. Pt is experiencing frequent urination and wants to make sure she's fine before the weekend. Please advise.

## 2019-06-08 ENCOUNTER — Encounter: Payer: Self-pay | Admitting: Obstetrics and Gynecology

## 2019-06-08 ENCOUNTER — Ambulatory Visit (INDEPENDENT_AMBULATORY_CARE_PROVIDER_SITE_OTHER): Payer: BC Managed Care – PPO | Admitting: Obstetrics and Gynecology

## 2019-06-08 ENCOUNTER — Other Ambulatory Visit: Payer: Self-pay

## 2019-06-08 VITALS — BP 133/84 | HR 121 | Ht 68.0 in | Wt 171.1 lb

## 2019-06-08 DIAGNOSIS — O09522 Supervision of elderly multigravida, second trimester: Secondary | ICD-10-CM | POA: Diagnosis not present

## 2019-06-08 NOTE — Progress Notes (Signed)
Patient comes in today for [facility] visit. She has several questions. She is having leg pain.

## 2019-06-08 NOTE — Progress Notes (Signed)
ROB: Patient complains of occasional leg ache especially right.  Symptomatic relief discussed, use of compression hose discussed.  Patient desires AFP today.  FAS and repeat cervical length ordered for 20 weeks.

## 2019-06-08 NOTE — Addendum Note (Signed)
Addended by: Durwin Glaze on: 06/08/2019 09:41 AM   Modules accepted: Orders

## 2019-06-11 LAB — AFP, SERUM, OPEN SPINA BIFIDA
AFP MoM: 1.86
AFP Value: 74.4 ng/mL
Gest. Age on Collection Date: 18 weeks
Maternal Age At EDD: 38 yr
OSBR Risk 1 IN: 1109
Test Results:: NEGATIVE
Weight: 171 [lb_av]

## 2019-06-20 ENCOUNTER — Telehealth: Payer: Self-pay | Admitting: Obstetrics and Gynecology

## 2019-06-20 NOTE — Telephone Encounter (Signed)
Pt called in and is requesting a call back from the nurse. Pt is 20 weeks and has started spotting. Please advise

## 2019-06-20 NOTE — Telephone Encounter (Signed)
Spoke with pt concerning her spotting. Pt stated that it was not heavy but only noticed it when she wipes. Pt stated yesterday that she noticed light pink/ with a little brown when she wipes. Pt was advised that if she notice bleeding like a cycle to please go to L&D to be seen. Pt was advised to keep her appointment for her u/s on Wednesday. Pt was advised that if the ultrasound tech notice anything of any concern that DJE was in office to speak with her. Pt stated that she understood.

## 2019-06-21 LAB — POCT URINALYSIS DIPSTICK OB
Bilirubin, UA: NEGATIVE
Blood, UA: NEGATIVE
Glucose, UA: NEGATIVE
Ketones, UA: NEGATIVE
Leukocytes, UA: NEGATIVE
Nitrite, UA: NEGATIVE
POC,PROTEIN,UA: NEGATIVE
Spec Grav, UA: 1.01 (ref 1.010–1.025)
Urobilinogen, UA: 0.2 E.U./dL
pH, UA: 6.5 (ref 5.0–8.0)

## 2019-06-21 NOTE — Addendum Note (Signed)
Addended by: Durwin Glaze on: 06/21/2019 08:04 AM   Modules accepted: Orders

## 2019-06-22 ENCOUNTER — Other Ambulatory Visit: Payer: Self-pay

## 2019-06-22 ENCOUNTER — Ambulatory Visit (INDEPENDENT_AMBULATORY_CARE_PROVIDER_SITE_OTHER): Payer: BC Managed Care – PPO

## 2019-06-22 DIAGNOSIS — O09522 Supervision of elderly multigravida, second trimester: Secondary | ICD-10-CM

## 2019-07-06 ENCOUNTER — Ambulatory Visit (INDEPENDENT_AMBULATORY_CARE_PROVIDER_SITE_OTHER): Payer: BC Managed Care – PPO | Admitting: Obstetrics and Gynecology

## 2019-07-06 ENCOUNTER — Other Ambulatory Visit: Payer: Self-pay

## 2019-07-06 ENCOUNTER — Encounter: Payer: Self-pay | Admitting: Obstetrics and Gynecology

## 2019-07-06 VITALS — BP 125/70 | HR 110 | Wt 175.6 lb

## 2019-07-06 DIAGNOSIS — O09512 Supervision of elderly primigravida, second trimester: Secondary | ICD-10-CM

## 2019-07-06 DIAGNOSIS — O09812 Supervision of pregnancy resulting from assisted reproductive technology, second trimester: Secondary | ICD-10-CM

## 2019-07-06 LAB — POCT URINALYSIS DIPSTICK OB
Bilirubin, UA: NEGATIVE
Blood, UA: NEGATIVE
Glucose, UA: NEGATIVE
Ketones, UA: NEGATIVE
Leukocytes, UA: NEGATIVE
Nitrite, UA: NEGATIVE
POC,PROTEIN,UA: NEGATIVE
Spec Grav, UA: <=1.005 — AB (ref 1.010–1.025)
Urobilinogen, UA: 0.2 E.U./dL
pH, UA: 6.5 (ref 5.0–8.0)

## 2019-07-06 NOTE — Patient Instructions (Addendum)

## 2019-07-06 NOTE — Progress Notes (Signed)
ROB: Doing well, no major complaints. Normal anatomy scan.  All general questions answered regarding pregnancy. Given info on doula program. RTC in 4 weeks.

## 2019-07-06 NOTE — Progress Notes (Signed)
ROB-Pt present for routine prenatal care. Pt stated that she was doing well no problems. Went over [employer] ready information. Pt already have plans to breastfeed and signed up for classes at another facility.

## 2019-07-29 NOTE — L&D Delivery Note (Signed)
Delivery Summary for Melanie Stevenson  Labor Events:   Preterm labor: No data found  Rupture date: 11/15/2019  Rupture time: 12:00 PM  Rupture type: Spontaneous Artificial  Fluid Color: Clear  Induction: No data found  Augmentation: No data found  Complications: No data found  Cervical ripening: No data found No data found   No data found     Delivery:   Episiotomy: No data found  Lacerations: No data found  Repair suture: No data found  Repair # of packets: No data found  Blood loss (ml): 800   Information for the patient's newborn:  [name], [name] [[id number]]    Delivery 11/15/2019 5:21 PM by  Vaginal, Spontaneous Sex:  female Gestational Age: [redacted]w[redacted]d Delivery Clinician:   Living?:         APGARS  One minute Five minutes Ten minutes  Skin color:        Heart rate:        Grimace:        Muscle tone:        Breathing:        Totals: 8  9      Presentation/position:      Resuscitation:   Cord information:    Disposition of cord blood:     Blood gases sent?  Complications:   Placenta: Delivered:       appearance Newborn Measurements: Weight: 9 lb 1 oz (4110 g)  Height: 20.87"  Head circumference:    Chest circumference:    Other providers:    Additional  information: Forceps:   Vacuum:   Breech:   Observed anomalies         Delivery Note At 5:21 PM a viable and healthy female was delivered via Vaginal, Spontaneous (Presentation: Vertex; Left Occiput Anterior).  APGAR: 8, 9; weight  4110 grams.   Placenta status: Spontaneous, Intact.  Cord: 3 vessels with the following complications: Nuchal cord x 1, reducible at perineum.  Cord pH: not obtained.  Delayed cord clamping observed until pulsation ceased.   Anesthesia: Epidural Episiotomy: None Lacerations: 2nd degree Suture Repair: 2.0 vicryl Est. Blood Loss (mL):  800 ml.  Mom to postpartum.  Baby to Couplet care / Skin to Skin.  Hildred Laser, MD 11/15/2019, 6:10 PM

## 2019-08-04 ENCOUNTER — Other Ambulatory Visit: Payer: Self-pay

## 2019-08-04 ENCOUNTER — Ambulatory Visit (INDEPENDENT_AMBULATORY_CARE_PROVIDER_SITE_OTHER): Payer: BC Managed Care – PPO | Admitting: Obstetrics and Gynecology

## 2019-08-04 VITALS — BP 125/85 | HR 114 | Wt 180.7 lb

## 2019-08-04 DIAGNOSIS — O09512 Supervision of elderly primigravida, second trimester: Secondary | ICD-10-CM

## 2019-08-04 LAB — POCT URINALYSIS DIPSTICK OB
Bilirubin, UA: NEGATIVE
Blood, UA: NEGATIVE
Glucose, UA: NEGATIVE
Ketones, UA: NEGATIVE
Leukocytes, UA: NEGATIVE
Nitrite, UA: NEGATIVE
POC,PROTEIN,UA: NEGATIVE
Spec Grav, UA: 1.01 (ref 1.010–1.025)
Urobilinogen, UA: 0.2 E.U./dL
pH, UA: 6.5 (ref 5.0–8.0)

## 2019-08-04 NOTE — Progress Notes (Signed)
ROB: No specific complaints.  Reports daily movement.  1 hour GCT and Tdap next visit.

## 2019-08-18 ENCOUNTER — Other Ambulatory Visit: Payer: Self-pay

## 2019-08-18 ENCOUNTER — Other Ambulatory Visit: Payer: BC Managed Care – PPO

## 2019-08-18 ENCOUNTER — Encounter: Payer: Self-pay | Admitting: Obstetrics and Gynecology

## 2019-08-18 ENCOUNTER — Ambulatory Visit (INDEPENDENT_AMBULATORY_CARE_PROVIDER_SITE_OTHER): Payer: BC Managed Care – PPO | Admitting: Obstetrics and Gynecology

## 2019-08-18 VITALS — BP 111/69 | HR 106 | Wt 184.0 lb

## 2019-08-18 DIAGNOSIS — Z23 Encounter for immunization: Secondary | ICD-10-CM | POA: Diagnosis not present

## 2019-08-18 DIAGNOSIS — O09513 Supervision of elderly primigravida, third trimester: Secondary | ICD-10-CM

## 2019-08-18 DIAGNOSIS — Z130 Encounter for screening for diseases of the blood and blood-forming organs and certain disorders involving the immune mechanism: Secondary | ICD-10-CM

## 2019-08-18 DIAGNOSIS — Z131 Encounter for screening for diabetes mellitus: Secondary | ICD-10-CM

## 2019-08-18 DIAGNOSIS — O09813 Supervision of pregnancy resulting from assisted reproductive technology, third trimester: Secondary | ICD-10-CM

## 2019-08-18 LAB — POCT URINALYSIS DIPSTICK OB
Bilirubin, UA: NEGATIVE
Blood, UA: NEGATIVE
Glucose, UA: NEGATIVE
Ketones, UA: NEGATIVE
Leukocytes, UA: NEGATIVE
Nitrite, UA: NEGATIVE
POC,PROTEIN,UA: NEGATIVE
Spec Grav, UA: <=1.005 — AB (ref 1.010–1.025)
Urobilinogen, UA: 0.2 E.U./dL
pH, UA: 7 (ref 5.0–8.0)

## 2019-08-18 MED ORDER — TETANUS-DIPHTH-ACELL PERTUSSIS 5-2.5-18.5 LF-MCG/0.5 IM SUSP
0.5000 mL | Freq: Once | INTRAMUSCULAR | Status: AC
  Administered 2019-08-18: 0.5 mL via INTRAMUSCULAR

## 2019-08-18 NOTE — Patient Instructions (Signed)
https://www.cdc.gov/vaccines/hcp/vis/vis-statements/tdap.pdf">  Tdap (Tetanus, Diphtheria, Pertussis) Vaccine: What You Need to Know 1. Why get vaccinated? Tdap vaccine can prevent tetanus, diphtheria, and pertussis. Diphtheria and pertussis spread from person to person. Tetanus enters the body through cuts or wounds.  TETANUS (T) causes painful stiffening of the muscles. Tetanus can lead to serious health problems, including being unable to open the mouth, having trouble swallowing and breathing, or death.  DIPHTHERIA (D) can lead to difficulty breathing, heart failure, paralysis, or death.  PERTUSSIS (aP), also known as "whooping cough," can cause uncontrollable, violent coughing which makes it hard to breathe, eat, or drink. Pertussis can be extremely serious in babies and young children, causing pneumonia, convulsions, brain damage, or death. In teens and adults, it can cause weight loss, loss of bladder control, passing out, and rib fractures from severe coughing. 2. Tdap vaccine Tdap is only for children 7 years and older, adolescents, and adults.  Adolescents should receive a single dose of Tdap, preferably at age 38 or 35 years. Pregnant women should get a dose of Tdap during every pregnancy, to protect the newborn from pertussis. Infants are most at risk for severe, life-threatening complications from pertussis. Adults who have never received Tdap should get a dose of Tdap. Also, adults should receive a booster dose every 10 years, or earlier in the case of a severe and dirty wound or burn. Booster doses can be either Tdap or Td (a different vaccine that protects against tetanus and diphtheria but not pertussis). Tdap may be given at the same time as other vaccines. 3. Talk with your health care provider Tell your vaccine provider if the person getting the vaccine:  Has had an allergic reaction after a previous dose of any vaccine that protects against tetanus, diphtheria, or pertussis,  or has any severe, life-threatening allergies.  Has had a coma, decreased level of consciousness, or prolonged seizures within 7 days after a previous dose of any pertussis vaccine (DTP, DTaP, or Tdap).  Has seizures or another nervous system problem.  Has ever had Guillain-Barr Syndrome (also called GBS).  Has had severe pain or swelling after a previous dose of any vaccine that protects against tetanus or diphtheria. In some cases, your health care provider may decide to postpone Tdap vaccination to a future visit.  People with minor illnesses, such as a cold, may be vaccinated. People who are moderately or severely ill should usually wait until they recover before getting Tdap vaccine.  Your health care provider can give you more information. 4. Risks of a vaccine reaction  Pain, redness, or swelling where the shot was given, mild fever, headache, feeling tired, and nausea, vomiting, diarrhea, or stomachache sometimes happen after Tdap vaccine. People sometimes faint after medical procedures, including vaccination. Tell your provider if you feel dizzy or have vision changes or ringing in the ears.  As with any medicine, there is a very remote chance of a vaccine causing a severe allergic reaction, other serious injury, or death. 5. What if there is a serious problem? An allergic reaction could occur after the vaccinated person leaves the clinic. If you see signs of a severe allergic reaction (hives, swelling of the face and throat, difficulty breathing, a fast heartbeat, dizziness, or weakness), call 9-1-1 and get the person to the nearest hospital. For other signs that concern you, call your health care provider.  Adverse reactions should be reported to the Vaccine Adverse Event Reporting System (VAERS). Your health care provider will usually file this report,  or you can do it yourself. Visit the VAERS website at www.vaers.hhs.gov or call 1-800-822-7967. VAERS is only for reporting  reactions, and VAERS staff do not give medical advice. 6. The National Vaccine Injury Compensation Program The National Vaccine Injury Compensation Program (VICP) is a federal program that was created to compensate people who may have been injured by certain vaccines. Visit the VICP website at www.hrsa.gov/vaccinecompensation or call 1-800-338-2382 to learn about the program and about filing a claim. There is a time limit to file a claim for compensation. 7. How can I learn more?  Ask your health care provider.  Call your local or state health department.  Contact the Centers for Disease Control and Prevention (CDC): ? Call 1-800-232-4636 (1-800-CDC-INFO) or ? Visit CDC's website at www.cdc.gov/vaccines Vaccine Information Statement Tdap (Tetanus, Diphtheria, Pertussis) Vaccine (10/27/2018) This information is not intended to replace advice given to you by your health care provider. Make sure you discuss any questions you have with your health care provider. Document Revised: 11/05/2018 Document Reviewed: 11/08/2018 Elsevier Patient Education  2020 [employer] Inc.   Breastfeeding  Choosing to breastfeed is one of the best decisions you can make for yourself and your baby. A change in hormones during pregnancy causes your breasts to make breast milk in your milk-producing glands. Hormones prevent breast milk from being released before your baby is born. They also prompt milk flow after birth. Once breastfeeding has begun, thoughts of your baby, as well as his or her sucking or crying, can stimulate the release of milk from your milk-producing glands. Benefits of breastfeeding Research shows that breastfeeding offers many health benefits for infants and mothers. It also offers a cost-free and convenient way to feed your baby. For your baby  Your first milk (colostrum) helps your baby's digestive system to function better.  Special cells in your milk (antibodies) help your baby to fight off  infections.  Breastfed babies are less likely to develop asthma, allergies, obesity, or type 2 diabetes. They are also at lower risk for sudden infant death syndrome (SIDS).  Nutrients in breast milk are better able to meet your baby's needs compared to infant formula.  Breast milk improves your baby's brain development. For you  Breastfeeding helps to create a very special bond between you and your baby.  Breastfeeding is convenient. Breast milk costs nothing and is always available at the correct temperature.  Breastfeeding helps to burn calories. It helps you to lose the weight that you gained during pregnancy.  Breastfeeding makes your uterus return faster to its size before pregnancy. It also slows bleeding (lochia) after you give birth.  Breastfeeding helps to lower your risk of developing type 2 diabetes, osteoporosis, rheumatoid arthritis, cardiovascular disease, and breast, ovarian, uterine, and endometrial cancer later in life. Breastfeeding basics Starting breastfeeding  Find a comfortable place to sit or lie down, with your neck and back well-supported.  Place a pillow or a rolled-up blanket under your baby to bring him or her to the level of your breast (if you are seated). Nursing pillows are specially designed to help support your arms and your baby while you breastfeed.  Make sure that your baby's tummy (abdomen) is facing your abdomen.  Gently massage your breast. With your fingertips, massage from the outer edges of your breast inward toward the nipple. This encourages milk flow. If your milk flows slowly, you may need to continue this action during the feeding.  Support your breast with 4 fingers underneath and your thumb   above your nipple (make the letter "C" with your hand). Make sure your fingers are well away from your nipple and your baby's mouth.  Stroke your baby's lips gently with your finger or nipple.  When your baby's mouth is open wide enough, quickly  bring your baby to your breast, placing your entire nipple and as much of the areola as possible into your baby's mouth. The areola is the colored area around your nipple. ? More areola should be visible above your baby's upper lip than below the lower lip. ? Your baby's lips should be opened and extended outward (flanged) to ensure an adequate, comfortable latch. ? Your baby's tongue should be between his or her lower gum and your breast.  Make sure that your baby's mouth is correctly positioned around your nipple (latched). Your baby's lips should create a seal on your breast and be turned out (everted).  It is common for your baby to suck about 2-3 minutes in order to start the flow of breast milk. Latching Teaching your baby how to latch onto your breast properly is very important. An improper latch can cause nipple pain, decreased milk supply, and poor weight gain in your baby. Also, if your baby is not latched onto your nipple properly, he or she may swallow some air during feeding. This can make your baby fussy. Burping your baby when you switch breasts during the feeding can help to get rid of the air. However, teaching your baby to latch on properly is still the best way to prevent fussiness from swallowing air while breastfeeding. Signs that your baby has successfully latched onto your nipple  Silent tugging or silent sucking, without causing you pain. Infant's lips should be extended outward (flanged).  Swallowing heard between every 3-4 sucks once your milk has started to flow (after your let-down milk reflex occurs).  Muscle movement above and in front of his or her ears while sucking. Signs that your baby has not successfully latched onto your nipple  Sucking sounds or smacking sounds from your baby while breastfeeding.  Nipple pain. If you think your baby has not latched on correctly, slip your finger into the corner of your baby's mouth to break the suction and place it between  your baby's gums. Attempt to start breastfeeding again. Signs of successful breastfeeding Signs from your baby  Your baby will gradually decrease the number of sucks or will completely stop sucking.  Your baby will fall asleep.  Your baby's body will relax.  Your baby will retain a small amount of milk in his or her mouth.  Your baby will let go of your breast by himself or herself. Signs from you  Breasts that have increased in firmness, weight, and size 1-3 hours after feeding.  Breasts that are softer immediately after breastfeeding.  Increased milk volume, as well as a change in milk consistency and color by the fifth day of breastfeeding.  Nipples that are not sore, cracked, or bleeding. Signs that your baby is getting enough milk  Wetting at least 1-2 diapers during the first 24 hours after birth.  Wetting at least 5-6 diapers every 24 hours for the first week after birth. The urine should be clear or pale yellow by the age of 5 days.  Wetting 6-8 diapers every 24 hours as your baby continues to grow and develop.  At least 3 stools in a 24-hour period by the age of 5 days. The stool should be soft and yellow.  At   least 3 stools in a 24-hour period by the age of 7 days. The stool should be seedy and yellow.  No loss of weight greater than 10% of birth weight during the first 3 days of life.  Average weight gain of 4-7 oz (113-198 g) per week after the age of 4 days.  Consistent daily weight gain by the age of 5 days, without weight loss after the age of 2 weeks. After a feeding, your baby may spit up a small amount of milk. This is normal. Breastfeeding frequency and duration Frequent feeding will help you make more milk and can prevent sore nipples and extremely full breasts (breast engorgement). Breastfeed when you feel the need to reduce the fullness of your breasts or when your baby shows signs of hunger. This is called "breastfeeding on demand." Signs that your baby  is hungry include:  Increased alertness, activity, or restlessness.  Movement of the head from side to side.  Opening of the mouth when the corner of the mouth or cheek is stroked (rooting).  Increased sucking sounds, smacking lips, cooing, sighing, or squeaking.  Hand-to-mouth movements and sucking on fingers or hands.  Fussing or crying. Avoid introducing a pacifier to your baby in the first 4-6 weeks after your baby is born. After this time, you may choose to use a pacifier. Research has shown that pacifier use during the first year of a baby's life decreases the risk of sudden infant death syndrome (SIDS). Allow your baby to feed on each breast as long as he or she wants. When your baby unlatches or falls asleep while feeding from the first breast, offer the second breast. Because newborns are often sleepy in the first few weeks of life, you may need to awaken your baby to get him or her to feed. Breastfeeding times will vary from baby to baby. However, the following rules can serve as a guide to help you make sure that your baby is properly fed:  Newborns (babies 4 weeks of age or younger) may breastfeed every 1-3 hours.  Newborns should not go without breastfeeding for longer than 3 hours during the day or 5 hours during the night.  You should breastfeed your baby a minimum of 8 times in a 24-hour period. Breast milk pumping     Pumping and storing breast milk allows you to make sure that your baby is exclusively fed your breast milk, even at times when you are unable to breastfeed. This is especially important if you go back to work while you are still breastfeeding, or if you are not able to be present during feedings. Your lactation consultant can help you find a method of pumping that works best for you and give you guidelines about how long it is safe to store breast milk. Caring for your breasts while you breastfeed Nipples can become dry, cracked, and sore while  breastfeeding. The following recommendations can help keep your breasts moisturized and healthy:  Avoid using soap on your nipples.  Wear a supportive bra designed especially for nursing. Avoid wearing underwire-style bras or extremely tight bras (sports bras).  Air-dry your nipples for 3-4 minutes after each feeding.  Use only cotton bra pads to absorb leaked breast milk. Leaking of breast milk between feedings is normal.  Use lanolin on your nipples after breastfeeding. Lanolin helps to maintain your skin's normal moisture barrier. Pure lanolin is not harmful (not toxic) to your baby. You may also hand express a few drops of breast   milk and gently massage that milk into your nipples and allow the milk to air-dry. In the first few weeks after giving birth, some women experience breast engorgement. Engorgement can make your breasts feel heavy, warm, and tender to the touch. Engorgement peaks within 3-5 days after you give birth. The following recommendations can help to ease engorgement:  Completely empty your breasts while breastfeeding or pumping. You may want to start by applying warm, moist heat (in the shower or with warm, water-soaked hand towels) just before feeding or pumping. This increases circulation and helps the milk flow. If your baby does not completely empty your breasts while breastfeeding, pump any extra milk after he or she is finished.  Apply ice packs to your breasts immediately after breastfeeding or pumping, unless this is too uncomfortable for you. To do this: ? Put ice in a plastic bag. ? Place a towel between your skin and the bag. ? Leave the ice on for 20 minutes, 2-3 times a day.  Make sure that your baby is latched on and positioned properly while breastfeeding. If engorgement persists after 48 hours of following these recommendations, contact your health care provider or a lactation consultant. Overall health care recommendations while breastfeeding  Eat 3  healthy meals and 3 snacks every day. Well-nourished mothers who are breastfeeding need an additional 450-500 calories a day. You can meet this requirement by increasing the amount of a balanced diet that you eat.  Drink enough water to keep your urine pale yellow or clear.  Rest often, relax, and continue to take your prenatal vitamins to prevent fatigue, stress, and low vitamin and mineral levels in your body (nutrient deficiencies).  Do not use any products that contain nicotine or tobacco, such as cigarettes and e-cigarettes. Your baby may be harmed by chemicals from cigarettes that pass into breast milk and exposure to secondhand smoke. If you need help quitting, ask your health care provider.  Avoid alcohol.  Do not use illegal drugs or marijuana.  Talk with your health care provider before taking any medicines. These include over-the-counter and prescription medicines as well as vitamins and herbal supplements. Some medicines that may be harmful to your baby can pass through breast milk.  It is possible to become pregnant while breastfeeding. If birth control is desired, ask your health care provider about options that will be safe while breastfeeding your baby. Where to find more information: [employer]: www.llli.org Contact a health care provider if:  You feel like you want to stop breastfeeding or have become frustrated with breastfeeding.  Your nipples are cracked or bleeding.  Your breasts are red, tender, or warm.  You have: ? Painful breasts or nipples. ? A swollen area on either breast. ? A fever or chills. ? Nausea or vomiting. ? Drainage other than breast milk from your nipples.  Your breasts do not become full before feedings by the fifth day after you give birth.  You feel sad and depressed.  Your baby is: ? Too sleepy to eat well. ? Having trouble sleeping. ? More than 1 week old and wetting fewer than 6 diapers in a 24-hour period. ? Not  gaining weight by 5 days of age.  Your baby has fewer than 3 stools in a 24-hour period.  Your baby's skin or the white parts of his or her eyes become yellow. Get help right away if:  Your baby is overly tired (lethargic) and does not want to wake up and feed.    Your baby develops an unexplained fever. Summary  Breastfeeding offers many health benefits for infant and mothers.  Try to breastfeed your infant when he or she shows early signs of hunger.  Gently tickle or stroke your baby's lips with your finger or nipple to allow the baby to open his or her mouth. Bring the baby to your breast. Make sure that much of the areola is in your baby's mouth. Offer one side and burp the baby before you offer the other side.  Talk with your health care provider or lactation consultant if you have questions or you face problems as you breastfeed. This information is not intended to replace advice given to you by your health care provider. Make sure you discuss any questions you have with your health care provider. Document Revised: 10/08/2017 Document Reviewed: 08/15/2016 Elsevier Patient Education  2020 [employer] Inc.  

## 2019-08-18 NOTE — Progress Notes (Signed)
ROB: Patient doing well, no complaints. Feels tight across abdomen mostly at night, not painful.  Discussed COVID vaccine in pregnancy. Recommend warm baths or Benadryl. Discussed birthplace tours. Has a doula. Discussed kick counts.For 28 week labs today.  Desires to breastfeed.  For Tdap today, signed blood consent.  Info for Ready Set Baby given. RTC in 2 weeks.

## 2019-08-18 NOTE — Progress Notes (Signed)
ROB-Pt present for routine prenatal care and 28 weeks labs. Pt Tdap and blood transfusion completed today. Went over [employer] account planner ready information today.

## 2019-08-19 LAB — RPR: RPR Ser Ql: NONREACTIVE

## 2019-08-19 LAB — CBC
Hematocrit: 36.5 % (ref 34.0–46.6)
Hemoglobin: 12.4 g/dL (ref 11.1–15.9)
MCH: 31.7 pg (ref 26.6–33.0)
MCHC: 34 g/dL (ref 31.5–35.7)
MCV: 93 fL (ref 79–97)
Platelets: 207 10*3/uL (ref 150–450)
RBC: 3.91 x10E6/uL (ref 3.77–5.28)
RDW: 12.4 % (ref 11.7–15.4)
WBC: 7.6 10*3/uL (ref 3.4–10.8)

## 2019-08-19 LAB — GLUCOSE, 1 HOUR GESTATIONAL: Gestational Diabetes Screen: 115 mg/dL (ref 65–139)

## 2019-09-01 ENCOUNTER — Ambulatory Visit (INDEPENDENT_AMBULATORY_CARE_PROVIDER_SITE_OTHER): Payer: BC Managed Care – PPO | Admitting: Obstetrics and Gynecology

## 2019-09-01 ENCOUNTER — Encounter: Payer: Self-pay | Admitting: Obstetrics and Gynecology

## 2019-09-01 ENCOUNTER — Other Ambulatory Visit: Payer: Self-pay

## 2019-09-01 VITALS — BP 122/80 | HR 115 | Wt 187.0 lb

## 2019-09-01 DIAGNOSIS — O09513 Supervision of elderly primigravida, third trimester: Secondary | ICD-10-CM

## 2019-09-01 LAB — POCT URINALYSIS DIPSTICK OB
Bilirubin, UA: NEGATIVE
Blood, UA: NEGATIVE
Glucose, UA: NEGATIVE
Ketones, UA: NEGATIVE
Leukocytes, UA: NEGATIVE
Nitrite, UA: NEGATIVE
POC,PROTEIN,UA: NEGATIVE
Spec Grav, UA: 1.01 (ref 1.010–1.025)
Urobilinogen, UA: 0.2 E.U./dL
pH, UA: 5.5 (ref 5.0–8.0)

## 2019-09-01 NOTE — Progress Notes (Signed)
ROB: No specific complaints.  Has occasional abdominal pressure sensation with standing but this resolves when she sits.  Reports daily fetal movement.  She states " I am a little bit anxious and would like visits 2 weeks apart".  No specific concerns.

## 2019-09-01 NOTE — Progress Notes (Signed)
Patient comes in today for  ROB visit.  

## 2019-09-14 ENCOUNTER — Other Ambulatory Visit: Payer: Self-pay

## 2019-09-14 ENCOUNTER — Encounter: Payer: Self-pay | Admitting: Obstetrics and Gynecology

## 2019-09-14 ENCOUNTER — Ambulatory Visit (INDEPENDENT_AMBULATORY_CARE_PROVIDER_SITE_OTHER): Payer: BC Managed Care – PPO | Admitting: Obstetrics and Gynecology

## 2019-09-14 VITALS — BP 129/76 | HR 114 | Wt 189.2 lb

## 2019-09-14 DIAGNOSIS — O09513 Supervision of elderly primigravida, third trimester: Secondary | ICD-10-CM | POA: Diagnosis not present

## 2019-09-14 DIAGNOSIS — O09813 Supervision of pregnancy resulting from assisted reproductive technology, third trimester: Secondary | ICD-10-CM

## 2019-09-14 LAB — POCT URINALYSIS DIPSTICK OB
Bilirubin, UA: NEGATIVE
Blood, UA: NEGATIVE
Glucose, UA: NEGATIVE
Ketones, UA: NEGATIVE
Leukocytes, UA: NEGATIVE
Nitrite, UA: NEGATIVE
POC,PROTEIN,UA: NEGATIVE
Spec Grav, UA: <=1.005 — AB (ref 1.010–1.025)
Urobilinogen, UA: 0.2 E.U./dL
pH, UA: 6.5 (ref 5.0–8.0)

## 2019-09-14 NOTE — Progress Notes (Signed)
ROB-Pt present for routine prenatal care. Pt stated that she was doing well no problems.  

## 2019-09-14 NOTE — Patient Instructions (Signed)
Diastasis Recti  Diastasis recti is when the muscles of the abdomen (rectus abdominis muscles) become thin and separate. The result is a wider space between the right and left abdomen (abdominal) muscles. This wider space between the muscles may cause a bulge in the middle of your abdomen. You may notice this bulge when you are straining or when you sit up from a lying down position. Diastasis recti can affect men and women. It is most common among pregnant women, infants, people who are obese, and people who have had abdominal surgery. Exercise or surgical treatment may help correct it. What are the causes? Common causes of this condition include:  Pregnancy. The growing uterus puts pressure on the abdominal muscles, which causes the muscles to separate.  Obesity. Excess fat puts pressure on abdominal muscles.  Weightlifting.  Some abdomen exercises.  Advanced age.  Genetics.  Prior abdominal surgery. What increases the risk? This condition is more likely to develop in:  Women.  Newborns, especially newborns who are born early (prematurely). What are the signs or symptoms? Common symptoms of this condition include:  A bulge in the middle of the abdomen. You will notice it most when you sit up or strain.  Pain in the low back, pelvis, or hips.  Constipation.  Inability to control when you urinate (urinary incontinence).  Bloating.  Poor posture. How is this diagnosed? This condition is diagnosed with a physical exam. Your health care provider will ask you to lie flat on your back and do a crunch or half sit-up. If you have diastasis recti, a vertical bulge will appear between your abdominal muscles in the center of your abdomen. Your health care provider will measure the gap between your muscles with one of the following:  A medical device used to measure the space between two objects (caliper).  A tape measure.  CT scan.  Ultrasound.  Finger spaces. Your health  care provider will measure the space using their fingers. How is this treated? If your muscle separation is not too large, you may not need treatment. However, if you are a woman who plans to become pregnant again, you should treat this condition before your next pregnancy. Treatment may include:  Physical therapy to strengthen and tighten your abdominal muscles.  Lifestyle changes such as weight loss and exercise.  Over-the-counter pain medicines as needed.  Surgery to correct the separation. Follow these instructions at home: Activity  Return to your normal activities as told by your health care provider. Ask your health care provider what activities are safe for you.  When lifting weights or doing exercises using your abdominal muscles or the muscles in the center of your body that give stability (core muscles), make sure you are doing your exercises and movements correctly. Proper form can help to prevent the condition from happening again. General instructions  If you are overweight, ask your health care provider for help with weight loss. Losing even a small amount of weight can help to improve your diastasis recti.  Take over-the-counter or prescription medicines only as told by your health care provider.  Do not strain. Straining can make the separation worse. Examples of straining include: ? Pushing hard to have a bowel movement, such as due to constipation. ? Lifting heavy objects, including children. ? Standing up and sitting down.  Take steps to prevent constipation: ? Drink enough fluid to keep your urine clear or pale yellow. ? Take over-the-counter or prescription medicines only as directed. ? Eat foods   that are high in fiber, such as fresh fruits and vegetables, whole grains, and beans. ? Limit foods that are high in fat and processed sugars, such as fried and sweet foods. Contact a health care provider if:  You notice a new bulge in your abdomen. Get help right  away if:  You experience severe discomfort in your abdomen.  You develop severe abdominal pain along with nausea, vomiting, or fever. Summary  Diastasis recti is when the abdomen (abdominal) muscles become thin and separate. Your abdomen will stick out because the space between your right and left abdomen muscles has widened.  The most common symptom is a bulge in your abdomen. You will notice it most when you sit up or are straining.  This condition is diagnosed during a physical exam.  If the abdomen separation is not too big, you may choose not to have treatment. Otherwise, you may need to undergo physical therapy or surgery. This information is not intended to replace advice given to you by your health care provider. Make sure you discuss any questions you have with your health care provider. Document Revised: 06/26/2017 Document Reviewed: 09/08/2016 Elsevier Patient Education  [place] of Pregnancy The third trimester is from week 28 through week 40 (months 7 through 9). The third trimester is a time when the unborn baby (fetus) is growing rapidly. At the end of the ninth month, the fetus is about 20 inches in length and weighs 6-10 pounds. Body changes during your third trimester Your body will continue to go through many changes during pregnancy. The changes vary from woman to woman. During the third trimester:  Your weight will continue to increase. You can expect to gain 25-35 pounds (11-16 kg) by the end of the pregnancy.  You may begin to get stretch marks on your hips, abdomen, and breasts.  You may urinate more often because the fetus is moving lower into your pelvis and pressing on your bladder.  You may develop or continue to have heartburn. This is caused by increased hormones that slow down muscles in the digestive tract.  You may develop or continue to have constipation because increased hormones slow digestion and cause the muscles  that push waste through your intestines to relax.  You may develop hemorrhoids. These are swollen veins (varicose veins) in the rectum that can itch or be painful.  You may develop swollen, bulging veins (varicose veins) in your legs.  You may have increased body aches in the pelvis, back, or thighs. This is due to weight gain and increased hormones that are relaxing your joints.  You may have changes in your hair. These can include thickening of your hair, rapid growth, and changes in texture. Some women also have hair loss during or after pregnancy, or hair that feels dry or thin. Your hair will most likely return to normal after your baby is born.  Your breasts will continue to grow and they will continue to become tender. A yellow fluid (colostrum) may leak from your breasts. This is the first milk you are producing for your baby.  Your belly button may stick out.  You may notice more swelling in your hands, face, or ankles.  You may have increased tingling or numbness in your hands, arms, and legs. The skin on your belly may also feel numb.  You may feel short of breath because of your expanding uterus.  You may have more problems sleeping. This can  be caused by the size of your belly, increased need to urinate, and an increase in your body's metabolism.  You may notice the fetus "dropping," or moving lower in your abdomen (lightening).  You may have increased vaginal discharge.  You may notice your joints feel loose and you may have pain around your pelvic bone. What to expect at prenatal visits You will have prenatal exams every 2 weeks until week 36. Then you will have weekly prenatal exams. During a routine prenatal visit:  You will be weighed to make sure you and the baby are growing normally.  Your blood pressure will be taken.  Your abdomen will be measured to track your baby's growth.  The fetal heartbeat will be listened to.  Any test results from the previous  visit will be discussed.  You may have a cervical check near your due date to see if your cervix has softened or thinned (effaced).  You will be tested for Group B streptococcus. This happens between 35 and 37 weeks. Your health care provider may ask you:  What your birth plan is.  How you are feeling.  If you are feeling the baby move.  If you have had any abnormal symptoms, such as leaking fluid, bleeding, severe headaches, or abdominal cramping.  If you are using any tobacco products, including cigarettes, chewing tobacco, and electronic cigarettes.  If you have any questions. Other tests or screenings that may be performed during your third trimester include:  Blood tests that check for low iron levels (anemia).  Fetal testing to check the health, activity level, and growth of the fetus. Testing is done if you have certain medical conditions or if there are problems during the pregnancy.  Nonstress test (NST). This test checks the health of your baby to make sure there are no signs of problems, such as the baby not getting enough oxygen. During this test, a belt is placed around your belly. The baby is made to move, and its heart rate is monitored during movement. What is false labor? False labor is a condition in which you feel small, irregular tightenings of the muscles in the womb (contractions) that usually go away with rest, changing position, or drinking water. These are called Braxton Hicks contractions. Contractions may last for hours, days, or even weeks before true labor sets in. If contractions come at regular intervals, become more frequent, increase in intensity, or become painful, you should see your health care provider. What are the signs of labor?  Abdominal cramps.  Regular contractions that start at 10 minutes apart and become stronger and more frequent with time.  Contractions that start on the top of the uterus and spread down to the lower abdomen and  back.  Increased pelvic pressure and dull back pain.  A watery or bloody mucus discharge that comes from the vagina.  Leaking of amniotic fluid. This is also known as your "water breaking." It could be a slow trickle or a gush. Let your health care provider know if it has a color or strange odor. If you have any of these signs, call your health care provider right away, even if it is before your due date. Follow these instructions at home: Medicines  Follow your health care provider's instructions regarding medicine use. Specific medicines may be either safe or unsafe to take during pregnancy.  Take a prenatal vitamin that contains at least 600 micrograms (mcg) of folic acid.  If you develop constipation, try taking a stool  softener if your health care provider approves. Eating and drinking   Eat a balanced diet that includes fresh fruits and vegetables, whole grains, good sources of protein such as meat, eggs, or tofu, and low-fat dairy. Your health care provider will help you determine the amount of weight gain that is right for you.  Avoid raw meat and uncooked cheese. These carry germs that can cause birth defects in the baby.  If you have low calcium intake from food, talk to your health care provider about whether you should take a daily calcium supplement.  Eat four or five small meals rather than three large meals a day.  Limit foods that are high in fat and processed sugars, such as fried and sweet foods.  To prevent constipation: ? Drink enough fluid to keep your urine clear or pale yellow. ? Eat foods that are high in fiber, such as fresh fruits and vegetables, whole grains, and beans. Activity  Exercise only as directed by your health care provider. Most women can continue their usual exercise routine during pregnancy. Try to exercise for 30 minutes at least 5 days a week. Stop exercising if you experience uterine contractions.  Avoid heavy lifting.  Do not exercise  in extreme heat or humidity, or at high altitudes.  Wear low-heel, comfortable shoes.  Practice good posture.  You may continue to have sex unless your health care provider tells you otherwise. Relieving pain and discomfort  Take frequent breaks and rest with your legs elevated if you have leg cramps or low back pain.  Take warm sitz baths to soothe any pain or discomfort caused by hemorrhoids. Use hemorrhoid cream if your health care provider approves.  Wear a good support bra to prevent discomfort from breast tenderness.  If you develop varicose veins: ? Wear support pantyhose or compression stockings as told by your healthcare provider. ? Elevate your feet for 15 minutes, 3-4 times a day. Prenatal care  Write down your questions. Take them to your prenatal visits.  Keep all your prenatal visits as told by your health care provider. This is important. Safety  Wear your seat belt at all times when driving.  Make a list of emergency phone numbers, including numbers for family, friends, the hospital, and police and fire departments. General instructions  Avoid cat litter boxes and soil used by cats. These carry germs that can cause birth defects in the baby. If you have a cat, ask someone to clean the litter box for you.  Do not travel far distances unless it is absolutely necessary and only with the approval of your health care provider.  Do not use hot tubs, steam rooms, or saunas.  Do not drink alcohol.  Do not use any products that contain nicotine or tobacco, such as cigarettes and e-cigarettes. If you need help quitting, ask your health care provider.  Do not use any medicinal herbs or unprescribed drugs. These chemicals affect the formation and growth of the baby.  Do not douche or use tampons or scented sanitary pads.  Do not cross your legs for long periods of time.  To prepare for the arrival of your baby: ? Take prenatal classes to understand, practice, and  ask questions about labor and delivery. ? Make a trial run to the hospital. ? Visit the hospital and tour the maternity area. ? Arrange for maternity or paternity leave through employers. ? Arrange for family and friends to take care of pets while you are in the hospital. ?  Purchase a rear-facing car seat and make sure you know how to install it in your car. ? Pack your hospital bag. ? Prepare the baby's nursery. Make sure to remove all pillows and stuffed animals from the baby's crib to prevent suffocation.  Visit your dentist if you have not gone during your pregnancy. Use a soft toothbrush to brush your teeth and be gentle when you floss. Contact a health care provider if:  You are unsure if you are in labor or if your water has broken.  You become dizzy.  You have mild pelvic cramps, pelvic pressure, or nagging pain in your abdominal area.  You have lower back pain.  You have persistent nausea, vomiting, or diarrhea.  You have an unusual or bad smelling vaginal discharge.  You have pain when you urinate. Get help right away if:  Your water breaks before 37 weeks.  You have regular contractions less than 5 minutes apart before 37 weeks.  You have a fever.  You are leaking fluid from your vagina.  You have spotting or bleeding from your vagina.  You have severe abdominal pain or cramping.  You have rapid weight loss or weight gain.  You have shortness of breath with chest pain.  You notice sudden or extreme swelling of your face, hands, ankles, feet, or legs.  Your baby makes fewer than 10 movements in 2 hours.  You have severe headaches that do not go away when you take medicine.  You have vision changes. Summary  The third trimester is from week 28 through week 40, months 7 through 9. The third trimester is a time when the unborn baby (fetus) is growing rapidly.  During the third trimester, your discomfort may increase as you and your baby continue to gain  weight. You may have abdominal, leg, and back pain, sleeping problems, and an increased need to urinate.  During the third trimester your breasts will keep growing and they will continue to become tender. A yellow fluid (colostrum) may leak from your breasts. This is the first milk you are producing for your baby.  False labor is a condition in which you feel small, irregular tightenings of the muscles in the womb (contractions) that eventually go away. These are called Braxton Hicks contractions. Contractions may last for hours, days, or even weeks before true labor sets in.  Signs of labor can include: abdominal cramps; regular contractions that start at 10 minutes apart and become stronger and more frequent with time; watery or bloody mucus discharge that comes from the vagina; increased pelvic pressure and dull back pain; and leaking of amniotic fluid. This information is not intended to replace advice given to you by your health care provider. Make sure you discuss any questions you have with your health care provider. Document Revised: 11/04/2018 Document Reviewed: 08/19/2016 Elsevier Patient Education  2020 [employer].

## 2019-09-14 NOTE — Progress Notes (Signed)
ROB: Doing well. Answered all general questions about pregnancy. Discussed need COVID testing at time of delivery.  Discussed patient's h/o anxiety, notes she never did well with meds in the past. Advised that if anxiety worsens postpartum, can consider natural methods, supplementation, and counseling. RTC in 2 weeks.

## 2019-09-15 ENCOUNTER — Encounter: Payer: BC Managed Care – PPO | Admitting: Obstetrics and Gynecology

## 2019-09-19 ENCOUNTER — Telehealth: Payer: Self-pay | Admitting: Obstetrics and Gynecology

## 2019-09-19 NOTE — Telephone Encounter (Signed)
Patient called saying she was having a significant increase on an fetal hiccup sensation. She's concerned about it, could you please advise this patient?  Thank you, Ardelle Lesches

## 2019-09-20 NOTE — Telephone Encounter (Signed)
Spoke with patient to let her know that Dr. Valentino Saxon said that it is nothing to be concerned about. Patient verbalized understanding.

## 2019-09-28 ENCOUNTER — Ambulatory Visit (INDEPENDENT_AMBULATORY_CARE_PROVIDER_SITE_OTHER): Payer: BC Managed Care – PPO | Admitting: Obstetrics and Gynecology

## 2019-09-28 ENCOUNTER — Other Ambulatory Visit: Payer: Self-pay

## 2019-09-28 VITALS — BP 113/71 | HR 118 | Wt 191.6 lb

## 2019-09-28 DIAGNOSIS — O09513 Supervision of elderly primigravida, third trimester: Secondary | ICD-10-CM

## 2019-09-28 LAB — POCT URINALYSIS DIPSTICK OB
Bilirubin, UA: NEGATIVE
Blood, UA: NEGATIVE
Glucose, UA: NEGATIVE
Ketones, UA: NEGATIVE
Leukocytes, UA: NEGATIVE
Nitrite, UA: NEGATIVE
POC,PROTEIN,UA: NEGATIVE
Spec Grav, UA: 1.01 (ref 1.010–1.025)
Urobilinogen, UA: 0.2 E.U./dL
pH, UA: 6.5 (ref 5.0–8.0)

## 2019-09-28 NOTE — Progress Notes (Signed)
ROB: No complaints.  Patient received her first Covid vaccine shot she is excited about this.  Next shot due in approximately 3 weeks.  Reports daily fetal movement including hiccups.  Briefly discussed GBS GC/CT cultures next week as well as beginning HSV prophylaxis.  Questions answered.  Patient continues to take prenatal vitamins and aspirin as directed.

## 2019-10-11 NOTE — Patient Instructions (Addendum)
Signs and Symptoms of Labor Labor is your body's natural process of moving your baby, placenta, and umbilical cord out of your uterus. The process of labor usually starts when your baby is full-term, between 66 and 40 weeks of pregnancy. How will I know when I am close to going into labor? As your body prepares for labor and the birth of your baby, you may notice the following symptoms in the weeks and days before true labor starts:  Having a strong desire to get your home ready to receive your new baby. This is called nesting. Nesting may be a sign that labor is approaching, and it may occur several weeks before birth. Nesting may involve cleaning and organizing your home.  Passing a small amount of thick, bloody mucus out of your vagina (normal bloody show or losing your mucus plug). This may happen more than a week before labor begins, or it might occur right before labor begins as the opening of the cervix starts to widen (dilate). For some women, the entire mucus plug passes at once. For others, smaller portions of the mucus plug may gradually pass over several days.  Your baby moving (dropping) lower in your pelvis to get into position for birth (lightening). When this happens, you may feel more pressure on your bladder and pelvic bone and less pressure on your ribs. This may make it easier to breathe. It may also cause you to need to urinate more often and have problems with bowel movements.  Having "practice contractions" (Braxton Hicks contractions) that occur at irregular (unevenly spaced) intervals that are more than 10 minutes apart. This is also called false labor. False labor contractions are common after exercise or sexual activity, and they will stop if you change position, rest, or drink fluids. These contractions are usually mild and do not get stronger over time. They may feel like: ? A backache or back pain. ? Mild cramps, similar to menstrual cramps. ? Tightening or pressure in  your abdomen. Other early symptoms that labor may be starting soon include:  Nausea or loss of appetite.  Diarrhea.  Having a sudden burst of energy, or feeling very tired.  Mood changes.  Having trouble sleeping. How will I know when labor has begun? Signs that true labor has begun may include:  Having contractions that come at regular (evenly spaced) intervals and increase in intensity. This may feel like more intense tightening or pressure in your abdomen that moves to your back. ? Contractions may also feel like rhythmic pain in your upper thighs or back that comes and goes at regular intervals. ? For first-time mothers, this change in intensity of contractions often occurs at a more gradual pace. ? Women who have given birth before may notice a more rapid progression of contraction changes.  Having a feeling of pressure in the vaginal area.  Your water breaking (rupture of membranes). This is when the sac of fluid that surrounds your baby breaks. When this happens, you will notice fluid leaking from your vagina. This may be clear or blood-tinged. Labor usually starts within 24 hours of your water breaking, but it may take longer to begin. ? Some women notice this as a gush of fluid. ? Others notice that their underwear repeatedly becomes damp. Follow these instructions at home:   When labor starts, or if your water breaks, call your health care provider or nurse care line. Based on your situation, they will determine when you should go in for  an exam.  When you are in early labor, you may be able to rest and manage symptoms at home. Some strategies to try at home include: ? Breathing and relaxation techniques. ? Taking a warm bath or shower. ? Listening to music. ? Using a heating pad on the lower back for pain. If you are directed to use heat:  Place a towel between your skin and the heat source.  Leave the heat on for 20-30 minutes.  Remove the heat if your skin turns  bright red. This is especially important if you are unable to feel pain, heat, or cold. You may have a greater risk of getting burned. Get help right away if:  You have painful, regular contractions that are 5 minutes apart or less.  Labor starts before you are [redacted] weeks along in your pregnancy.  You have a fever.  You have a headache that does not go away.  You have bright red blood coming from your vagina.  You do not feel your baby moving.  You have a sudden onset of: ? Severe headache with vision problems. ? Nausea, vomiting, or diarrhea. ? Chest pain or shortness of breath. These symptoms may be an emergency. If your health care provider recommends that you go to the hospital or birth center where you plan to deliver, do not drive yourself. Have someone else drive you, or call emergency services (911 in the [place] Summary  Labor is your body's natural process of moving your baby, placenta, and umbilical cord out of your uterus.  The process of labor usually starts when your baby is full-term, between 60 and 40 weeks of pregnancy.  When labor starts, or if your water breaks, call your health care provider or nurse care line. Based on your situation, they will determine when you should go in for an exam. This information is not intended to replace advice given to you by your health care provider. Make sure you discuss any questions you have with your health care provider. Document Revised: 04/13/2017 Document Reviewed: 12/19/2016 Elsevier Patient Education  [employer].   Common Medications Safe in Pregnancy  Acne:      Constipation:  Benzoyl Peroxide     Colace  Clindamycin      Dulcolax Suppository  Topica Erythromycin     Fibercon  Salicylic Acid      Metamucil         Miralax AVOID:        Senakot   Accutane    Cough:  Retin-A       Cough Drops  Tetracycline      Phenergan w/ Codeine if Rx  Minocycline      Robitussin (Plain &  DM)  Antibiotics:     Crabs/Lice:  Ceclor       RID  Cephalosporins    AVOID:  E-Mycins      Kwell  Keflex  Macrobid/Macrodantin   Diarrhea:  Penicillin      Kao-Pectate  Zithromax      Imodium AD         PUSH FLUIDS AVOID:       Cipro     Fever:  Tetracycline      Tylenol (Regular or Extra  Minocycline       Strength)  Levaquin      Extra Strength-Do not          Exceed 8 tabs/24 hrs Caffeine:        <257m/day (equiv. To 1 cup  of coffee or  approx. 3 12 oz sodas)         Gas: Cold/Hayfever:       Gas-X  Benadryl      Mylicon  Claritin       Phazyme  **Claritin-D        Chlor-Trimeton    Headaches:  Dimetapp      ASA-Free Excedrin  Drixoral-Non-Drowsy     Cold Compress  Mucinex (Guaifenasin)     Tylenol (Regular or Extra  Sudafed/Sudafed-12 Hour     Strength)  **Sudafed PE Pseudoephedrine   Tylenol Cold & Sinus     Vicks Vapor Rub  Zyrtec  **AVOID if Problems With Blood Pressure         Heartburn: Avoid lying down for at least 1 hour after meals  Aciphex      Maalox     Rash:  Milk of Magnesia     Benadryl    Mylanta       1% Hydrocortisone Cream  Pepcid  Pepcid Complete   Sleep Aids:  Prevacid      Ambien   Prilosec       Benadryl  Rolaids       Chamomile Tea  Tums (Limit 4/day)     Unisom  Zantac       Tylenol PM         Warm milk-add vanilla or  Hemorrhoids:       Sugar for taste  Anusol/Anusol H.C.  (RX: Analapram 2.5%)  Sugar Substitutes:  Hydrocortisone OTC     Ok in moderation  Preparation H      Tucks        Vaseline lotion applied to tissue with wiping    Herpes:     Throat:  Acyclovir      Oragel  Famvir  Valtrex     Vaccines:         Flu Shot Leg Cramps:       *Gardasil  Benadryl      Hepatitis A         Hepatitis B Nasal Spray:       Pneumovax  Saline Nasal Spray     Polio Booster         Tetanus Nausea:       Tuberculosis test or PPD  Vitamin B6 25 mg TID   AVOID:    Dramamine      *Gardasil  Emetrol       Live  Poliovirus  Ginger Root 250 mg QID    MMR (measles, mumps &  High Complex Carbs @ Bedtime    rebella)  Sea Bands-Accupressure    Varicella (Chickenpox)  Unisom 1/2 tab TID     *No known complications           If received before Pain:         Known pregnancy;   Darvocet       Resume series after  Lortab        Delivery  Percocet    Yeast:   Tramadol      Femstat  Tylenol 3      Gyne-lotrimin  Ultram       Monistat  Vicodin           MISC:         All Sunscreens           Hair Coloring/highlights          Insect Repellant's          (  Including DEET)         Mystic Tans

## 2019-10-11 NOTE — Progress Notes (Signed)
ROB-Pt present for routine prenatal care and 36 week labs. Pt stated that she was doing well no problems.

## 2019-10-12 ENCOUNTER — Ambulatory Visit (INDEPENDENT_AMBULATORY_CARE_PROVIDER_SITE_OTHER): Payer: BC Managed Care – PPO | Admitting: Obstetrics and Gynecology

## 2019-10-12 ENCOUNTER — Encounter: Payer: Self-pay | Admitting: Obstetrics and Gynecology

## 2019-10-12 ENCOUNTER — Other Ambulatory Visit: Payer: Self-pay

## 2019-10-12 VITALS — BP 103/70 | HR 121 | Wt 195.1 lb

## 2019-10-12 DIAGNOSIS — O09513 Supervision of elderly primigravida, third trimester: Secondary | ICD-10-CM

## 2019-10-12 DIAGNOSIS — Z3A36 36 weeks gestation of pregnancy: Secondary | ICD-10-CM | POA: Diagnosis not present

## 2019-10-12 DIAGNOSIS — Z8619 Personal history of other infectious and parasitic diseases: Secondary | ICD-10-CM

## 2019-10-12 DIAGNOSIS — Z0289 Encounter for other administrative examinations: Secondary | ICD-10-CM

## 2019-10-12 DIAGNOSIS — Z789 Other specified health status: Secondary | ICD-10-CM

## 2019-10-12 LAB — POCT URINALYSIS DIPSTICK OB
Bilirubin, UA: NEGATIVE
Blood, UA: NEGATIVE
Glucose, UA: NEGATIVE
Ketones, UA: NEGATIVE
Leukocytes, UA: NEGATIVE
Nitrite, UA: NEGATIVE
POC,PROTEIN,UA: NEGATIVE
Spec Grav, UA: <=1.005 — AB (ref 1.010–1.025)
Urobilinogen, UA: 0.2 E.U./dL
pH, UA: 6 (ref 5.0–8.0)

## 2019-10-12 MED ORDER — VALACYCLOVIR HCL 500 MG PO TABS: 500.0000 mg | ORAL_TABLET | Freq: Two times a day (BID) | ORAL | 1 refills | Status: DC

## 2019-10-12 NOTE — Progress Notes (Signed)
ROB: Patient notes no major issues.  To receive second COVID vaccine next week. Answered all general questions regarding labor precautions. To begin HSV suppression. 36 week labs done. Unable to confirm presentation, will order scan. Birth plan reviewed, will scan to chart. RTC in 1 week.

## 2019-10-14 LAB — GC/CHLAMYDIA PROBE AMP
Chlamydia trachomatis, NAA: NEGATIVE
Neisseria Gonorrhoeae by PCR: NEGATIVE

## 2019-10-14 LAB — STREP GP B NAA: Strep Gp B NAA: NEGATIVE

## 2019-10-19 ENCOUNTER — Ambulatory Visit (INDEPENDENT_AMBULATORY_CARE_PROVIDER_SITE_OTHER): Payer: BC Managed Care – PPO

## 2019-10-19 ENCOUNTER — Other Ambulatory Visit: Payer: Self-pay

## 2019-10-19 ENCOUNTER — Encounter: Payer: Self-pay | Admitting: Obstetrics and Gynecology

## 2019-10-19 ENCOUNTER — Ambulatory Visit (INDEPENDENT_AMBULATORY_CARE_PROVIDER_SITE_OTHER): Payer: BC Managed Care – PPO | Admitting: Obstetrics and Gynecology

## 2019-10-19 VITALS — BP 110/74 | HR 105 | Wt 195.0 lb

## 2019-10-19 DIAGNOSIS — O09513 Supervision of elderly primigravida, third trimester: Secondary | ICD-10-CM | POA: Diagnosis not present

## 2019-10-19 LAB — POCT URINALYSIS DIPSTICK OB
Bilirubin, UA: NEGATIVE
Blood, UA: NEGATIVE
Glucose, UA: NEGATIVE
Ketones, UA: NEGATIVE
Leukocytes, UA: NEGATIVE
Nitrite, UA: NEGATIVE
POC,PROTEIN,UA: NEGATIVE
Spec Grav, UA: 1.01 (ref 1.010–1.025)
Urobilinogen, UA: 0.2 E.U./dL
pH, UA: 5 (ref 5.0–8.0)

## 2019-10-19 NOTE — Progress Notes (Signed)
ROB: Patient had BPP and estimated fetal weight done today.8/8 and 76 percentile.  Vertex.  Patient denies contractions.  Reports daily fetal movement.  We discussed birth plan and presence of Dula.  All questions answered.

## 2019-10-19 NOTE — Progress Notes (Signed)
Patient comes in today for ROB visit. She is having heartburn.

## 2019-10-20 ENCOUNTER — Other Ambulatory Visit: Payer: Self-pay

## 2019-10-20 MED ORDER — OMEPRAZOLE 10 MG PO CPDR: 10.0000 mg | DELAYED_RELEASE_CAPSULE | Freq: Every day | ORAL | 1 refills | Status: DC

## 2019-10-25 NOTE — Progress Notes (Signed)
ROB-pt present for routine prenatal care. Pt stated having some cramping off and on but not sure if is contractions or not. Pt stated that she was overall doing well denies any issues at this time.

## 2019-10-26 ENCOUNTER — Ambulatory Visit (INDEPENDENT_AMBULATORY_CARE_PROVIDER_SITE_OTHER): Payer: BC Managed Care – PPO | Admitting: Obstetrics and Gynecology

## 2019-10-26 ENCOUNTER — Encounter: Payer: Self-pay | Admitting: Obstetrics and Gynecology

## 2019-10-26 ENCOUNTER — Other Ambulatory Visit: Payer: Self-pay

## 2019-10-26 VITALS — BP 109/73 | HR 123 | Wt 197.5 lb

## 2019-10-26 DIAGNOSIS — O09813 Supervision of pregnancy resulting from assisted reproductive technology, third trimester: Secondary | ICD-10-CM

## 2019-10-26 DIAGNOSIS — O09513 Supervision of elderly primigravida, third trimester: Secondary | ICD-10-CM

## 2019-10-26 DIAGNOSIS — Z3A38 38 weeks gestation of pregnancy: Secondary | ICD-10-CM | POA: Diagnosis not present

## 2019-10-26 LAB — POCT URINALYSIS DIPSTICK OB
Bilirubin, UA: NEGATIVE
Blood, UA: NEGATIVE
Glucose, UA: NEGATIVE
Ketones, UA: NEGATIVE
Leukocytes, UA: NEGATIVE
Nitrite, UA: NEGATIVE
POC,PROTEIN,UA: NEGATIVE
Spec Grav, UA: 1.01 (ref 1.010–1.025)
Urobilinogen, UA: 0.2 E.U./dL
pH, UA: 6 (ref 5.0–8.0)

## 2019-10-26 NOTE — Patient Instructions (Signed)

## 2019-10-26 NOTE — Progress Notes (Signed)
ROB: Patient notes some mild cramping on and off for several days. Otherwise doing well. Discussed signs/symptoms of labor. RTC in 1 week.

## 2019-11-02 ENCOUNTER — Ambulatory Visit (INDEPENDENT_AMBULATORY_CARE_PROVIDER_SITE_OTHER): Payer: BC Managed Care – PPO | Admitting: Obstetrics and Gynecology

## 2019-11-02 ENCOUNTER — Encounter: Payer: Self-pay | Admitting: Obstetrics and Gynecology

## 2019-11-02 ENCOUNTER — Other Ambulatory Visit: Payer: Self-pay

## 2019-11-02 VITALS — BP 118/77 | HR 106 | Wt 199.0 lb

## 2019-11-02 DIAGNOSIS — Z3A39 39 weeks gestation of pregnancy: Secondary | ICD-10-CM

## 2019-11-02 DIAGNOSIS — O09513 Supervision of elderly primigravida, third trimester: Secondary | ICD-10-CM | POA: Diagnosis not present

## 2019-11-02 DIAGNOSIS — O480 Post-term pregnancy: Secondary | ICD-10-CM

## 2019-11-02 NOTE — Progress Notes (Signed)
ROB: Patient with rare contractions.  Reports daily fetal movement.  Taking Valtrex as directed.  BPP and growth ultrasound with next visit.  Possible postdates induction discussion begun.

## 2019-11-03 LAB — POCT URINALYSIS DIPSTICK OB
Bilirubin, UA: NEGATIVE
Blood, UA: NEGATIVE
Glucose, UA: NEGATIVE
Ketones, UA: NEGATIVE
Leukocytes, UA: NEGATIVE
Nitrite, UA: NEGATIVE
POC,PROTEIN,UA: NEGATIVE
Spec Grav, UA: 1.01 (ref 1.010–1.025)
Urobilinogen, UA: 0.2 E.U./dL
pH, UA: 5 (ref 5.0–8.0)

## 2019-11-03 NOTE — Addendum Note (Signed)
Addended by: Dorian Pod on: 11/03/2019 04:42 PM   Modules accepted: Orders

## 2019-11-09 ENCOUNTER — Other Ambulatory Visit: Payer: Self-pay

## 2019-11-09 ENCOUNTER — Encounter: Payer: Self-pay | Admitting: Obstetrics and Gynecology

## 2019-11-09 ENCOUNTER — Other Ambulatory Visit: Payer: BC Managed Care – PPO

## 2019-11-09 ENCOUNTER — Ambulatory Visit (INDEPENDENT_AMBULATORY_CARE_PROVIDER_SITE_OTHER): Payer: BC Managed Care – PPO

## 2019-11-09 ENCOUNTER — Ambulatory Visit (INDEPENDENT_AMBULATORY_CARE_PROVIDER_SITE_OTHER): Payer: BC Managed Care – PPO | Admitting: Obstetrics and Gynecology

## 2019-11-09 VITALS — BP 123/77 | HR 112 | Wt 198.1 lb

## 2019-11-09 DIAGNOSIS — O480 Post-term pregnancy: Secondary | ICD-10-CM

## 2019-11-09 DIAGNOSIS — O09513 Supervision of elderly primigravida, third trimester: Secondary | ICD-10-CM

## 2019-11-09 DIAGNOSIS — Z3A40 40 weeks gestation of pregnancy: Secondary | ICD-10-CM

## 2019-11-09 DIAGNOSIS — O09813 Supervision of pregnancy resulting from assisted reproductive technology, third trimester: Secondary | ICD-10-CM

## 2019-11-09 DIAGNOSIS — Z8619 Personal history of other infectious and parasitic diseases: Secondary | ICD-10-CM

## 2019-11-09 LAB — POCT URINALYSIS DIPSTICK OB
Bilirubin, UA: NEGATIVE
Blood, UA: NEGATIVE
Glucose, UA: NEGATIVE
Ketones, UA: NEGATIVE
Leukocytes, UA: NEGATIVE
Nitrite, UA: NEGATIVE
POC,PROTEIN,UA: NEGATIVE
Spec Grav, UA: <=1.005 — AB (ref 1.010–1.025)
Urobilinogen, UA: 0.2 E.U./dL
pH, UA: 6 (ref 5.0–8.0)

## 2019-11-09 MED ORDER — VALACYCLOVIR HCL 500 MG PO TABS: 500.0000 mg | ORAL_TABLET | Freq: Two times a day (BID) | ORAL | 0 refills | Status: DC

## 2019-11-09 NOTE — Patient Instructions (Addendum)
COVID 19 Instructions for Scheduled Procedure (Inductions/C-sections and GYN surgeries)   Thank you for choosing [facility] for your services.  You have been scheduled for a procedure called ____________Induction of Labor_____________________________.    Your procedure is scheduled on ______________4/20/2021____________________________.  You are required to have COVID-19 testing performed 2 days prior to your scheduled procedure date.  Testing is performed between 9 and 11 AM Monday through Friday.  Please present for testing on ______Monday, 4/19/2021__________________ during this hour. Drive up testing is performed in front of [facility] (this is next to the [employer]).    Upon your scheduled procedure date, you will need to arrive at the [facility] entrance. (There is a statue at the front of this entrance.)   Please arrive on time if you are scheduled for an induction of labor.   If you are scheduled for a Cesarean delivery or for Gyn Surgery, arrive 2 hours prior to your procedure time.   If you are an Obstetric patient and your arrival time falls between 11 PM and 6 AM call L&D [phone]) when you arrive.  A staff member will meet you at the [facility] entrance.  At this time, patients are allowed 1 support person to accompany them. Face masks are required for you and your support person. Your support person is now allowed to be there with you during the entire time of your admission.   Please contact the office if you have any questions regarding this information.  The [facility] office number is [phone].     Thank you,    Your [facility] Providers     Labor Induction  Labor induction is when steps are taken to cause a pregnant woman to begin the labor process. Most women go into labor on their own between 37 weeks and 42 weeks of pregnancy. When this does not happen or when there is a medical need for labor to begin, steps may be  taken to induce labor. Labor induction causes a pregnant woman's uterus to contract. It also causes the cervix to soften (ripen), open (dilate), and thin out (efface). Usually, labor is not induced before 39 weeks of pregnancy unless there is a medical reason to do so. Your health care provider will determine if labor induction is needed. Before inducing labor, your health care provider will consider a number of factors, including:  Your medical condition and your baby's.  How many weeks along you are in your pregnancy.  How mature your baby's lungs are.  The condition of your cervix.  The position of your baby.  The size of your birth canal. What are some reasons for labor induction? Labor may be induced if:  Your health or your baby's health is at risk.  Your pregnancy is overdue by 1 week or more.  Your water breaks but labor does not start on its own.  There is a low amount of amniotic fluid around your baby. You may also choose (elect) to have labor induced at a certain time. Generally, elective labor induction is done no earlier than 39 weeks of pregnancy. What methods are used for labor induction? Methods used for labor induction include:  Prostaglandin medicine. This medicine starts contractions and causes the cervix to dilate and ripen. It can be taken by mouth (orally) or by being inserted into the vagina (suppository).  Inserting a small, thin tube (catheter) with a balloon into the vagina and then expanding the balloon with  water to dilate the cervix.  Stripping the membranes. In this method, your health care provider gently separates amniotic sac tissue from the cervix. This causes the cervix to stretch, which in turn causes the release of a hormone called progesterone. The hormone causes the uterus to contract. This procedure is often done during an office visit, after which you will be sent home to wait for contractions to begin.  Breaking the water. In this method,  your health care provider uses a small instrument to make a small hole in the amniotic sac. This eventually causes the amniotic sac to break. Contractions should begin after a few hours.  Medicine to trigger or strengthen contractions. This medicine is given through an IV that is inserted into a vein in your arm. Except for membrane stripping, which can be done in a clinic, labor induction is done in the hospital so that you and your baby can be carefully monitored. How long does it take for labor to be induced? The length of time it takes to induce labor depends on how ready your body is for labor. Some inductions can take up to 2-3 days, while others may take less than a day. Induction may take longer if:  You are induced early in your pregnancy.  It is your first pregnancy.  Your cervix is not ready. What are some risks associated with labor induction? Some risks associated with labor induction include:  Changes in fetal heart rate, such as being too high, too low, or irregular (erratic).  Failed induction.  Infection in the mother or the baby.  Increased risk of having a cesarean delivery.  Fetal death.  Breaking off (abruption) of the placenta from the uterus (rare).  Rupture of the uterus (very rare). When induction is needed for medical reasons, the benefits of induction generally outweigh the risks. What are some reasons for not inducing labor? Labor induction should not be done if:  Your baby does not tolerate contractions.  You have had previous surgeries on your uterus, such as a myomectomy, removal of fibroids, or a vertical scar from a previous cesarean delivery.  Your placenta lies very low in your uterus and blocks the opening of the cervix (placenta previa).  Your baby is not in a head-down position.  The umbilical cord drops down into the birth canal in front of the baby.  There are unusual circumstances, such as the baby being very early (premature).  You  have had more than 2 previous cesarean deliveries. Summary  Labor induction is when steps are taken to cause a pregnant woman to begin the labor process.  Labor induction causes a pregnant woman's uterus to contract. It also causes the cervix to ripen, dilate, and efface.  Labor is not induced before 39 weeks of pregnancy unless there is a medical reason to do so.  When induction is needed for medical reasons, the benefits of induction generally outweigh the risks. This information is not intended to replace advice given to you by your health care provider. Make sure you discuss any questions you have with your health care provider. Document Revised: 07/17/2017 Document Reviewed: 08/27/2016 Elsevier Patient Education  2020 [employer].

## 2019-11-09 NOTE — Progress Notes (Signed)
ROB-Pt present for routine prenatal care. Pt stated that she was doing well no problems.  

## 2019-11-09 NOTE — Progress Notes (Signed)
ROB: Patient overall doing well, no complaints. Does note some swelling of the feet, using compression stockings. Discussed IOL for postdates pregnancy.  Will schedule for 11/15/19.  [place] today for growth and AFI wnl. For NST this weekend, Saturday at noon.  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 Discussed need for COVID screening, given instructions.

## 2019-11-12 ENCOUNTER — Other Ambulatory Visit: Payer: Self-pay

## 2019-11-12 ENCOUNTER — Encounter: Payer: Self-pay | Admitting: Obstetrics and Gynecology

## 2019-11-12 ENCOUNTER — Observation Stay: Admission: RE | Admit: 2019-11-12 | Payer: BC Managed Care – PPO | Admitting: Obstetrics and Gynecology

## 2019-11-12 ENCOUNTER — Observation Stay (HOSPITAL_BASED_OUTPATIENT_CLINIC_OR_DEPARTMENT_OTHER)
Admission: RE | Admit: 2019-11-12 | Discharge: 2019-11-12 | Disposition: A | Discharge status: Home / Self Care | Payer: BC Managed Care – PPO | Source: Ambulatory Visit | Attending: Obstetrics and Gynecology | Admitting: Obstetrics and Gynecology

## 2019-11-12 DIAGNOSIS — Z369 Encounter for antenatal screening, unspecified: Secondary | ICD-10-CM | POA: Insufficient documentation

## 2019-11-12 DIAGNOSIS — Z3A40 40 weeks gestation of pregnancy: Secondary | ICD-10-CM

## 2019-11-12 NOTE — OB Triage Note (Signed)
Patient to OBS 4 for scheduled NST ordered by Dr Valentino Saxon for postdates.  Patient is [redacted]w[redacted]d. No complaints.  Patient reports positive fetal movements.  No bleeding or LOF.

## 2019-11-12 NOTE — Discharge Instructions (Signed)
Please keep your next scheduled appointment.  If you have any questions or concerns you may call your on call provider.  If you have urgent concerns please go to your nearest emergency department for evaluation.  You may also call the nurses desk at [facility] with questions @ [phone].

## 2019-11-13 NOTE — Discharge Summary (Signed)
    L&D OB Triage Note  SUBJECTIVE Melanie Stevenson is a 39 y.o. G1P0000 female at [redacted]w[redacted]d, EDD Estimated Date of Delivery: 11/09/19 who presented to triage for scheduled NST for postdates.   OB History  Gravida Para Term Preterm AB Living  1 0 0 0 0 0  SAB TAB Ectopic Multiple Live Births  0 0 0 0 0    # Outcome Date GA Lbr Len/2nd Weight Sex Delivery Anes PTL Lv  1 Current             No medications prior to admission.     OBJECTIVE  Nursing Evaluation:   BP 121/84 (BP Location: Right Arm)   Pulse (!) 131   Temp 99 F (37.2 C) (Oral)   Resp 16   LMP 02/02/2019    Findings:        Reactive NST      NST was performed and has been reviewed by me.  NST INTERPRETATION: Category I  Mode: External Baseline Rate (A): 170 bpm(fht) Variability: Moderate Accelerations: 10 x 10, 15 x 15 Decelerations: None     Contraction Frequency (min): occasional  ASSESSMENT Impression:  1.  Pregnancy:  G1P0000 at [redacted]w[redacted]d , EDD Estimated Date of Delivery: 11/09/19 2.  Reassuring fetal and maternal status  PLAN 1. Discussed current condition and above findings with patient and reassurance given.  All questions answered. 2. Discharge home with standard labor precautions given to return to L&D or call the office for problems. 3. Continue routine prenatal care.

## 2019-11-14 ENCOUNTER — Other Ambulatory Visit: Payer: Self-pay

## 2019-11-14 ENCOUNTER — Other Ambulatory Visit
Admission: RE | Admit: 2019-11-14 | Discharge: 2019-11-14 | Disposition: A | Discharge status: Home / Self Care | Payer: BC Managed Care – PPO | Source: Ambulatory Visit | Attending: Obstetrics and Gynecology | Admitting: Obstetrics and Gynecology

## 2019-11-14 DIAGNOSIS — Z20822 Contact with and (suspected) exposure to covid-19: Secondary | ICD-10-CM | POA: Insufficient documentation

## 2019-11-14 DIAGNOSIS — Z01812 Encounter for preprocedural laboratory examination: Secondary | ICD-10-CM | POA: Insufficient documentation

## 2019-11-15 ENCOUNTER — Encounter: Payer: Self-pay | Admitting: Obstetrics and Gynecology

## 2019-11-15 ENCOUNTER — Inpatient Hospital Stay: Payer: BC Managed Care – PPO | Admitting: Anesthesiology

## 2019-11-15 ENCOUNTER — Inpatient Hospital Stay
Admission: RE | Admit: 2019-11-15 | Discharge: 2019-11-17 | DRG: 807 | Disposition: A | Discharge status: Home / Self Care | Payer: BC Managed Care – PPO | Attending: Obstetrics and Gynecology | Admitting: Obstetrics and Gynecology

## 2019-11-15 DIAGNOSIS — Z3A40 40 weeks gestation of pregnancy: Secondary | ICD-10-CM | POA: Diagnosis not present

## 2019-11-15 DIAGNOSIS — O9081 Anemia of the puerperium: Secondary | ICD-10-CM | POA: Diagnosis not present

## 2019-11-15 DIAGNOSIS — O09813 Supervision of pregnancy resulting from assisted reproductive technology, third trimester: Secondary | ICD-10-CM

## 2019-11-15 DIAGNOSIS — Z20822 Contact with and (suspected) exposure to covid-19: Secondary | ICD-10-CM | POA: Diagnosis present

## 2019-11-15 DIAGNOSIS — O480 Post-term pregnancy: Secondary | ICD-10-CM | POA: Diagnosis present

## 2019-11-15 DIAGNOSIS — O09522 Supervision of elderly multigravida, second trimester: Secondary | ICD-10-CM

## 2019-11-15 LAB — CBC
HCT: 39.7 % (ref 36.0–46.0)
Hemoglobin: 13.2 g/dL (ref 12.0–15.0)
MCH: 30.3 pg (ref 26.0–34.0)
MCHC: 33.2 g/dL (ref 30.0–36.0)
MCV: 91.1 fL (ref 80.0–100.0)
Platelets: 230 10*3/uL (ref 150–400)
RBC: 4.36 MIL/uL (ref 3.87–5.11)
RDW: 13.9 % (ref 11.5–15.5)
WBC: 7.5 10*3/uL (ref 4.0–10.5)
nRBC: 0 % (ref 0.0–0.2)

## 2019-11-15 LAB — TYPE AND SCREEN
ABO/RH(D): A POS
Antibody Screen: NEGATIVE

## 2019-11-15 LAB — ABO/RH: ABO/RH(D): A POS

## 2019-11-15 LAB — SARS CORONAVIRUS 2 (TAT 6-24 HRS): SARS Coronavirus 2: NEGATIVE

## 2019-11-15 MED ORDER — OXYTOCIN 10 UNIT/ML IJ SOLN
INTRAMUSCULAR | Status: AC
  Filled 2019-11-15: qty 2

## 2019-11-15 MED ORDER — PHENYLEPHRINE 40 MCG/ML (10ML) SYRINGE FOR IV PUSH (FOR BLOOD PRESSURE SUPPORT): 80.0000 ug | PREFILLED_SYRINGE | INTRAVENOUS | Status: DC | PRN

## 2019-11-15 MED ORDER — ACETAMINOPHEN 325 MG PO TABS: 650.0000 mg | ORAL_TABLET | ORAL | Status: DC | PRN

## 2019-11-15 MED ORDER — COCONUT OIL OIL
1.0000 "application " | TOPICAL_OIL | Status: DC | PRN
  Administered 2019-11-16: 1 via TOPICAL
  Filled 2019-11-15: qty 120

## 2019-11-15 MED ORDER — MISOPROSTOL 200 MCG PO TABS
ORAL_TABLET | ORAL | Status: AC
  Filled 2019-11-15: qty 4

## 2019-11-15 MED ORDER — SIMETHICONE 80 MG PO CHEW: 80.0000 mg | CHEWABLE_TABLET | ORAL | Status: DC | PRN

## 2019-11-15 MED ORDER — BUPIVACAINE HCL (PF) 0.25 % IJ SOLN
INTRAMUSCULAR | Status: DC | PRN
  Administered 2019-11-15: 1 mL via INTRATHECAL
  Administered 2019-11-15: 5 mL via INTRATHECAL

## 2019-11-15 MED ORDER — MISOPROSTOL 25 MCG QUARTER TABLET: 25.0000 ug | ORAL_TABLET | ORAL | Status: DC | PRN

## 2019-11-15 MED ORDER — OXYCODONE-ACETAMINOPHEN 5-325 MG PO TABS: 2.0000 | ORAL_TABLET | ORAL | Status: DC | PRN

## 2019-11-15 MED ORDER — BUTORPHANOL TARTRATE 1 MG/ML IJ SOLN
1.0000 mg | INTRAMUSCULAR | Status: DC | PRN
  Filled 2019-11-15: qty 1

## 2019-11-15 MED ORDER — SENNOSIDES-DOCUSATE SODIUM 8.6-50 MG PO TABS
2.0000 | ORAL_TABLET | ORAL | Status: DC
  Administered 2019-11-16 – 2019-11-17 (×2): 2 via ORAL
  Filled 2019-11-15 (×2): qty 2

## 2019-11-15 MED ORDER — MISOPROSTOL 50MCG HALF TABLET: 50.0000 ug | ORAL_TABLET | ORAL | Status: DC | PRN

## 2019-11-15 MED ORDER — LIDOCAINE HCL (PF) 1 % IJ SOLN: 30.0000 mL | INTRAMUSCULAR | Status: DC | PRN

## 2019-11-15 MED ORDER — EPHEDRINE 5 MG/ML INJ: 10.0000 mg | INTRAVENOUS | Status: DC | PRN

## 2019-11-15 MED ORDER — ZOLPIDEM TARTRATE 5 MG PO TABS: 5.0000 mg | ORAL_TABLET | Freq: Every evening | ORAL | Status: DC | PRN

## 2019-11-15 MED ORDER — LACTATED RINGERS IV SOLN: INTRAVENOUS | Status: DC

## 2019-11-15 MED ORDER — PRENATAL MULTIVITAMIN CH
1.0000 | ORAL_TABLET | Freq: Every day | ORAL | Status: DC
  Filled 2019-11-15 (×2): qty 1

## 2019-11-15 MED ORDER — SOD CITRATE-CITRIC ACID 500-334 MG/5ML PO SOLN: 30.0000 mL | ORAL | Status: DC | PRN

## 2019-11-15 MED ORDER — FENTANYL 2.5 MCG/ML W/ROPIVACAINE 0.15% IN NS 100 ML EPIDURAL (ARMC)
12.0000 mL/h | EPIDURAL | Status: DC
  Administered 2019-11-15: 12 mL/h via EPIDURAL

## 2019-11-15 MED ORDER — MISOPROSTOL 25 MCG QUARTER TABLET
ORAL_TABLET | ORAL | Status: AC
  Administered 2019-11-15: 02:00:00 25 ug via VAGINAL
  Filled 2019-11-15: qty 1

## 2019-11-15 MED ORDER — OXYTOCIN 40 UNITS IN NORMAL SALINE INFUSION - SIMPLE MED
2.5000 [IU]/h | INTRAVENOUS | Status: DC
  Filled 2019-11-15: qty 1000

## 2019-11-15 MED ORDER — LACTATED RINGERS IV SOLN: 500.0000 mL | Freq: Once | INTRAVENOUS | Status: AC

## 2019-11-15 MED ORDER — LACTATED RINGERS IV SOLN
500.0000 mL | INTRAVENOUS | Status: DC | PRN
  Administered 2019-11-15 (×2): 500 mL via INTRAVENOUS

## 2019-11-15 MED ORDER — OXYCODONE-ACETAMINOPHEN 5-325 MG PO TABS: 1.0000 | ORAL_TABLET | ORAL | Status: DC | PRN

## 2019-11-15 MED ORDER — LIDOCAINE HCL (PF) 1 % IJ SOLN
INTRAMUSCULAR | Status: DC | PRN
  Administered 2019-11-15: 3 mL via SUBCUTANEOUS

## 2019-11-15 MED ORDER — LIDOCAINE HCL (PF) 1 % IJ SOLN
INTRAMUSCULAR | Status: AC
  Filled 2019-11-15: qty 30

## 2019-11-15 MED ORDER — SODIUM CHLORIDE 0.9 % IV SOLN
INTRAVENOUS | Status: DC | PRN
  Administered 2019-11-15: 4 mL via EPIDURAL
  Administered 2019-11-15: 5 mL via EPIDURAL
  Administered 2019-11-15: 4 mL via EPIDURAL

## 2019-11-15 MED ORDER — IBUPROFEN 600 MG PO TABS
600.0000 mg | ORAL_TABLET | Freq: Four times a day (QID) | ORAL | Status: DC
  Administered 2019-11-15 – 2019-11-16 (×2): 600 mg via ORAL
  Filled 2019-11-15 (×2): qty 1

## 2019-11-15 MED ORDER — DIPHENHYDRAMINE HCL 50 MG/ML IJ SOLN: 12.5000 mg | INTRAMUSCULAR | Status: DC | PRN

## 2019-11-15 MED ORDER — TERBUTALINE SULFATE 1 MG/ML IJ SOLN
0.2500 mg | Freq: Once | INTRAMUSCULAR | Status: DC | PRN
  Filled 2019-11-15 (×2): qty 1

## 2019-11-15 MED ORDER — METHYLERGONOVINE MALEATE 0.2 MG/ML IJ SOLN
INTRAMUSCULAR | Status: AC
  Filled 2019-11-15: qty 1

## 2019-11-15 MED ORDER — DIBUCAINE (PERIANAL) 1 % EX OINT: 1.0000 "application " | TOPICAL_OINTMENT | CUTANEOUS | Status: DC | PRN

## 2019-11-15 MED ORDER — CARBOPROST TROMETHAMINE 250 MCG/ML IM SOLN
INTRAMUSCULAR | Status: AC
  Filled 2019-11-15: qty 1

## 2019-11-15 MED ORDER — AMMONIA AROMATIC IN INHA
RESPIRATORY_TRACT | Status: AC
  Filled 2019-11-15: qty 10

## 2019-11-15 MED ORDER — DIPHENHYDRAMINE HCL 25 MG PO CAPS: 25.0000 mg | ORAL_CAPSULE | Freq: Four times a day (QID) | ORAL | Status: DC | PRN

## 2019-11-15 MED ORDER — WITCH HAZEL-GLYCERIN EX PADS: 1.0000 "application " | MEDICATED_PAD | CUTANEOUS | Status: DC | PRN

## 2019-11-15 MED ORDER — FENTANYL 2.5 MCG/ML W/ROPIVACAINE 0.15% IN NS 100 ML EPIDURAL (ARMC)
EPIDURAL | Status: AC
  Filled 2019-11-15: qty 100

## 2019-11-15 MED ORDER — ONDANSETRON HCL 4 MG PO TABS: 4.0000 mg | ORAL_TABLET | ORAL | Status: DC | PRN

## 2019-11-15 MED ORDER — BENZOCAINE-MENTHOL 20-0.5 % EX AERO
1.0000 "application " | INHALATION_SPRAY | CUTANEOUS | Status: DC | PRN
  Filled 2019-11-15: qty 56

## 2019-11-15 MED ORDER — ACETAMINOPHEN 325 MG PO TABS
650.0000 mg | ORAL_TABLET | ORAL | Status: DC | PRN
  Administered 2019-11-15 – 2019-11-17 (×8): 650 mg via ORAL
  Filled 2019-11-15 (×8): qty 2

## 2019-11-15 MED ORDER — OXYTOCIN BOLUS FROM INFUSION
500.0000 mL | Freq: Once | INTRAVENOUS | Status: AC
  Administered 2019-11-15: 500 mL via INTRAVENOUS

## 2019-11-15 MED ORDER — ONDANSETRON HCL 4 MG/2ML IJ SOLN: 4.0000 mg | INTRAMUSCULAR | Status: DC | PRN

## 2019-11-15 MED ORDER — ONDANSETRON HCL 4 MG/2ML IJ SOLN
4.0000 mg | Freq: Four times a day (QID) | INTRAMUSCULAR | Status: DC | PRN
  Administered 2019-11-15: 4 mg via INTRAVENOUS
  Filled 2019-11-15: qty 2

## 2019-11-15 NOTE — H&P (Addendum)
Obstetric History and Physical  Melanie Stevenson is a 39 y.o. G1P0000 with IUP at [redacted]w[redacted]d presenting for scheduled induction of labor for postdates gestation.  Current pregnancy was via IVF. Patient states she has been having none contractions, none vaginal bleeding, intact membranes, with active fetal movement.    Prenatal Course Source of Care: [facility] with onset of care at 9 weeks Pregnancy complications or risks: Patient Active Problem List   Diagnosis Date Noted  . Post-dates pregnancy 11/15/2019  . Pregnancy conceived through in vitro fertilization 05/13/2019  . History of LEEP (loop electrosurgical excision procedure) of cervix complicating pregnancy 05/13/2019  . Supervision of high risk elderly multigravida in second trimester 05/13/2019  . Overweight (BMI 25.0-29.9) 10/06/2017   She plans to breastfeed She desires no method for postpartum contraception.   Prenatal labs and studies: ABO, Rh: --/--/A POS (04/20 3646) Antibody: NEG (04/20 0512) Rubella: Immune (06/03 0000) RPR: Non Reactive (01/21 1133)  HBsAg: Negative (06/03 0000)  HIV: Non-reactive (06/03 0000)  OEH:OZYYQMGN/-- (03/17 0943) 1 hr Glucola  normal Genetic screening normal Anatomy US normal   Past Medical History:  Diagnosis Date  . Abnormal Pap smear of cervix   . Anxiety   . Depression   . HPV (human papilloma virus) infection   . HSV infection     Past Surgical History:  Procedure Laterality Date  . CERVICAL BIOPSY  W/ LOOP ELECTRODE EXCISION    . KNEE SURGERY Right     OB History  Gravida Para Term Preterm AB Living  1 0 0 0 0 0  SAB TAB Ectopic Multiple Live Births  0 0 0 0 0    # Outcome Date GA Lbr Len/2nd Weight Sex Delivery Anes PTL Lv  1 Current             Social History   Socioeconomic History  . Marital status: Married    Spouse name: Not on file  . Number of children: Not on file  . Years of education: Not on file  . Highest education level: Not on file   Occupational History  . Not on file  Tobacco Use  . Smoking status: Never Smoker  . Smokeless tobacco: Never Used  Substance and Sexual Activity  . Alcohol use: Not Currently    Comment: Few beers a week   . Drug use: No  . Sexual activity: Yes    Birth control/protection: None    Comment: undecided  Other Topics Concern  . Not on file  Social History Narrative  . Not on file   Social Determinants of Health   Financial Resource Strain:   . Difficulty of Paying Living Expenses:   Food Insecurity:   . Worried About Programme researcher, broadcasting/film/video in the Last Year:   . Barista in the Last Year:   Transportation Needs:   . Freight forwarder (Medical):   Marland Kitchen Lack of Transportation (Non-Medical):   Physical Activity:   . Days of Exercise per Week:   . Minutes of Exercise per Session:   Stress:   . Feeling of Stress :   Social Connections:   . Frequency of Communication with Friends and Family:   . Frequency of Social Gatherings with Friends and Family:   . Attends Religious Services:   . Active Member of Clubs or Organizations:   . Attends Banker Meetings:   Marland Kitchen Marital Status:     Family History  Problem Relation Age of  Onset  . Healthy Mother   . Healthy Father   . Breast cancer Neg Hx   . Ovarian cancer Neg Hx   . Cervical cancer Neg Hx     Medications Prior to Admission  Medication Sig Dispense Refill Last Dose  . aspirin EC 81 MG tablet Take 1 tablet (81 mg total) by mouth daily. Take after 12 weeks for prevention of preeclampssia later in pregnancy 300 tablet 2 11/14/2019 at Unknown time  . folic acid (FOLVITE) 169 MCG tablet Take 400 mcg by mouth daily.   11/15/2019 at Unknown time  . Inositol-D Chiro-Inositol (OVASITOL PO) Take by mouth.   11/15/2019 at Unknown time  . loratadine (CLARITIN) 10 MG tablet Take 10 mg by mouth daily.   11/15/2019 at Unknown time  . Prenatal Vit-Fe Fumarate-FA (MULTIVITAMIN-PRENATAL) 27-0.8 MG TABS tablet Take 1 tablet by  mouth daily at 12 noon.   11/15/2019 at Unknown time  . Probiotic Product (PROBIOTIC COLON SUPPORT) CAPS Take by mouth.   11/14/2019 at Unknown time  . valACYclovir (VALTREX) 500 MG tablet Take 1 tablet (500 mg total) by mouth 2 (two) times daily. 30 tablet 0 11/15/2019 at Unknown time  . omeprazole (PRILOSEC) 10 MG capsule Take 1 capsule (10 mg total) by mouth daily. (Patient not taking: Reported on 10/26/2019) 30 capsule 1 Not Taking at Unknown time    Allergies  Allergen Reactions  . Codeine     Other reaction(s): Vomiting    Review of Systems: Negative except for what is mentioned in HPI.  Physical Exam: BP 100/61 (BP Location: Left Arm)   Pulse (!) 119   Temp 98.2 F (36.8 C) (Oral)   Resp 16   Ht 5\' 7"  (1.702 m)   Wt 88.9 kg   LMP 02/02/2019   BMI 30.70 kg/m  CONSTITUTIONAL: Well-developed, well-nourished female in no acute distress.  HENT:  Normocephalic, atraumatic, External right and left ear normal. Oropharynx is clear and moist EYES: Conjunctivae and EOM are normal. Pupils are equal, round, and reactive to light. No scleral icterus.  NECK: Normal range of motion, supple, no masses SKIN: Skin is warm and dry. No rash noted. Not diaphoretic. No erythema. No pallor. NEUROLOGIC: Alert and oriented to person, place, and time. Normal reflexes, muscle tone coordination. No cranial nerve deficit noted. PSYCHIATRIC: Normal mood and affect. Normal behavior. Normal judgment and thought content. CARDIOVASCULAR: Normal heart rate noted, regular rhythm RESPIRATORY: Effort and breath sounds normal, no problems with respiration noted ABDOMEN: Soft, nontender, nondistended, gravid. MUSCULOSKELETAL: Normal range of motion. No edema and no tenderness. 2+ distal pulses.  Cervical Exam: Dilatation 1cm   Effacement 30%   Station -3   Presentation: cephalic FHT:  Baseline rate 140 bpm   Variability moderate  Accelerations present   Decelerations occasional prolonged, lasting 3-4 minutes.   Contractions: Every 1-3 mins   Pertinent Labs/Studies:   Results for orders placed or performed during the hospital encounter of 11/15/19 (from the past 24 hour(s))  CBC     Status: None   Collection Time: 11/15/19 12:54 AM  Result Value Ref Range   WBC 7.5 4.0 - 10.5 K/uL   RBC 4.36 3.87 - 5.11 MIL/uL   Hemoglobin 13.2 12.0 - 15.0 g/dL   HCT 39.7 36.0 - 46.0 %   MCV 91.1 80.0 - 100.0 fL   MCH 30.3 26.0 - 34.0 pg   MCHC 33.2 30.0 - 36.0 g/dL   RDW 13.9 11.5 - 15.5 %   Platelets 230 150 -  400 K/uL   nRBC 0.0 0.0 - 0.2 %  ABO/Rh     Status: None   Collection Time: 11/15/19  2:43 AM  Result Value Ref Range   ABO/RH(D)      A POS Performed at [facility], [address] [place], [place] [address]   Type and screen     Status: None   Collection Time: 11/15/19  5:12 AM  Result Value Ref Range   ABO/RH(D) A POS    Antibody Screen NEG    Sample Expiration      11/18/2019,2359 Performed at [facility], [address], [place], [place] [address]     Assessment : TORRENCE BRANAGAN is a 39 y.o. G1P0000 at [redacted]w[redacted]d being admitted forinduction of labor due to postdates. IVF pregnancy. AMA status. H/o HSV, on prophylaxis. H/o LEEP.   Plan: Labor:  Patient s/p 1 dose of Cytotec with periods of non-reassuring fetal status. Foley bulb placed.  Possible accidental AROM noted. Analgesia as needed. FWB: Category 2 tracing, more reassuring since Foley bulb placement. GBS negative.  If fetal status more reassuring, will allow patient to proceed to intermittent monitoring.  Delivery plan: Hopeful for vaginal delivery   Hildred Laser, MD [facility]

## 2019-11-15 NOTE — Anesthesia Procedure Notes (Signed)
Epidural Patient location during procedure: OB Start time: 11/15/2019 1:25 PM End time: 11/15/2019 1:40 PM  Staffing Anesthesiologist: Alver Fisher, MD Resident/CRNA: Stormy Fabian, CRNA Performed: anesthesiologist and resident/CRNA   Preanesthetic Checklist Completed: patient identified, IV checked, site marked, risks and benefits discussed, surgical consent, monitors and equipment checked, pre-op evaluation and timeout performed  Epidural Patient position: sitting Prep: ChloraPrep Patient monitoring: heart rate, continuous pulse ox and blood pressure Approach: midline Location: L3-L4 Injection technique: LOR saline  Needle:  Needle type: Tuohy  Needle gauge: 17 G Needle length: 9 cm and 9 Needle insertion depth: 6 cm Catheter type: closed end flexible Catheter size: 19 Gauge Catheter at skin depth: 10 cm Test dose: Other (No test dose. CSE)  Assessment Sensory level: T10 Events: blood not aspirated, injection not painful, no injection resistance, no paresthesia and negative IV test  Additional Notes 2 attempt. CSE placed. +CSF flow from spinal needle. 1 ml of 0.25% bupivacaine injected via spinal needle. Patient tolerated well.  Pt. Evaluated and documentation done after procedure finished. Patient identified. Risks/Benefits/Options discussed with patient including but not limited to bleeding, infection, nerve damage, paralysis, failed block, incomplete pain control, headache, blood pressure changes, nausea, vomiting, reactions to medication both or allergic, itching and postpartum back pain. Confirmed with bedside nurse the patient's most recent platelet count. Confirmed with patient that they are not currently taking any anticoagulation, have any bleeding history or any family history of bleeding disorders. Patient expressed understanding and wished to proceed. All questions were answered. Sterile technique was used throughout the entire procedure. Please see nursing notes  for vital signs. Test dose was given through epidural catheter and negative prior to continuing to dose epidural or start infusion. Warning signs of high block given to the patient including shortness of breath, tingling/numbness in hands, complete motor block, or any concerning symptoms with instructions to call for help. Patient was given instructions on fall risk and not to get out of bed. All questions and concerns addressed with instructions to call with any issues or inadequate analgesia.   Patient tolerated the insertion well without immediate complications.Reason for block:procedure for pain

## 2019-11-15 NOTE — Anesthesia Preprocedure Evaluation (Signed)
Anesthesia Evaluation  Patient identified by MRN, date of birth, ID band Patient awake    Reviewed: Allergy & Precautions, H&P , NPO status , Patient's Chart, lab work & pertinent test results  History of Anesthesia Complications Negative for: history of anesthetic complications  Airway Mallampati: II  TM Distance: >3 FB   Mouth opening: Limited Mouth Opening  Dental no notable dental hx.    Pulmonary neg pulmonary ROS,    Pulmonary exam normal        Cardiovascular negative cardio ROS Normal cardiovascular exam     Neuro/Psych Anxiety Depression negative neurological ROS     GI/Hepatic negative GI ROS, Neg liver ROS,   Endo/Other  negative endocrine ROS  Renal/GU negative Renal ROS  negative genitourinary   Musculoskeletal   Abdominal   Peds  Hematology negative hematology ROS (+)   Anesthesia Other Findings   Reproductive/Obstetrics (+) Pregnancy                             Anesthesia Physical Anesthesia Plan  ASA: II  Anesthesia Plan: Epidural   Post-op Pain Management:    Induction:   PONV Risk Score and Plan:   Airway Management Planned:   Additional Equipment:   Intra-op Plan:   Post-operative Plan:   Informed Consent: I have reviewed the patients History and Physical, chart, labs and discussed the procedure including the risks, benefits and alternatives for the proposed anesthesia with the patient or authorized representative who has indicated his/her understanding and acceptance.       Plan Discussed with: Anesthesiologist  Anesthesia Plan Comments:         Anesthesia Quick Evaluation

## 2019-11-15 NOTE — Anesthesia Procedure Notes (Addendum)
Epidural Patient location during procedure: OB Start time: 11/15/2019 12:30 PM End time: 11/15/2019 12:44 PM  Staffing Anesthesiologist: Piscitello, Cleda Mccreedy, MD Resident/CRNA: Lynden Oxford, CRNA Other anesthesia staff: Stormy Fabian, CRNA Performed: resident/CRNA   Preanesthetic Checklist Completed: patient identified, IV checked, site marked, risks and benefits discussed, surgical consent, monitors and equipment checked, pre-op evaluation and timeout performed  Epidural Patient position: sitting Prep: ChloraPrep Patient monitoring: heart rate, continuous pulse ox and blood pressure Approach: midline Location: L3-L4 Injection technique: LOR saline  Needle:  Needle type: Tuohy  Needle gauge: 17 G Needle length: 9 cm and 9 Needle insertion depth: 5.5 cm Catheter type: closed end flexible Catheter size: 19 Gauge Catheter at skin depth: 12 cm Test dose: negative and 1.5% lidocaine with Epi 1:200 K  Assessment Sensory level: T10 Events: blood not aspirated, injection not painful, no injection resistance, no paresthesia and negative IV test  Additional Notes 2 attempt Pt. Evaluated and documentation done after procedure finished. Patient identified. Risks/Benefits/Options discussed with patient including but not limited to bleeding, infection, nerve damage, paralysis, failed block, incomplete pain control, headache, blood pressure changes, nausea, vomiting, reactions to medication both or allergic, itching and postpartum back pain. Confirmed with bedside nurse the patient's most recent platelet count. Confirmed with patient that they are not currently taking any anticoagulation, have any bleeding history or any family history of bleeding disorders. Patient expressed understanding and wished to proceed. All questions were answered. Sterile technique was used throughout the entire procedure. Please see nursing notes for vital signs. Test dose was given through epidural catheter and  negative prior to continuing to dose epidural or start infusion. Warning signs of high block given to the patient including shortness of breath, tingling/numbness in hands, complete motor block, or any concerning symptoms with instructions to call for help. Patient was given instructions on fall risk and not to get out of bed. All questions and concerns addressed with instructions to call with any issues or inadequate analgesia.   Patient tolerated the insertion well without immediate complications.Reason for block:procedure for pain

## 2019-11-16 LAB — CBC
HCT: 30.4 % — ABNORMAL LOW (ref 36.0–46.0)
Hemoglobin: 9.8 g/dL — ABNORMAL LOW (ref 12.0–15.0)
MCH: 30.4 pg (ref 26.0–34.0)
MCHC: 32.2 g/dL (ref 30.0–36.0)
MCV: 94.4 fL (ref 80.0–100.0)
Platelets: 158 10*3/uL (ref 150–400)
RBC: 3.22 MIL/uL — ABNORMAL LOW (ref 3.87–5.11)
RDW: 14.3 % (ref 11.5–15.5)
WBC: 11.4 10*3/uL — ABNORMAL HIGH (ref 4.0–10.5)
nRBC: 0 % (ref 0.0–0.2)

## 2019-11-16 LAB — RPR: RPR Ser Ql: NONREACTIVE

## 2019-11-16 MED ORDER — IBUPROFEN 600 MG PO TABS
600.0000 mg | ORAL_TABLET | Freq: Four times a day (QID) | ORAL | Status: DC
  Administered 2019-11-16 – 2019-11-17 (×6): 600 mg via ORAL
  Filled 2019-11-16 (×6): qty 1

## 2019-11-16 NOTE — Anesthesia Postprocedure Evaluation (Signed)
Anesthesia Post Note  Patient: Melanie Stevenson  Procedure(s) Performed: AN AD HOC LABOR EPIDURAL  Patient location during evaluation: Mother Baby Anesthesia Type: Epidural Level of consciousness: awake and alert Pain management: pain level controlled Vital Signs Assessment: post-procedure vital signs reviewed and stable Respiratory status: spontaneous breathing, nonlabored ventilation and respiratory function stable Cardiovascular status: stable Postop Assessment: no headache, no backache and epidural receding Anesthetic complications: no     Last Vitals:  Vitals:   11/16/19 0347 11/16/19 0816  BP: 109/80 109/71  Pulse: 89 96  Resp: 18 20  Temp: 36.4 C 36.8 C  SpO2: 100% 99%    Last Pain:  Vitals:   11/16/19 0816  TempSrc: Axillary  PainSc:                  Elmarie Mainland

## 2019-11-16 NOTE — Plan of Care (Signed)
Vs stable; up with assist; taking tylenol and motrin for pain control; breastfeeding and does need some assistance some of the times; was able to void post delivery out on mother/baby unit

## 2019-11-16 NOTE — Progress Notes (Signed)
Post Partum Day # 1, s/p SVD  Subjective: no complaints, up ad lib, voiding and tolerating PO  Objective: Temp:  [97.6 F (36.4 C)-99.5 F (37.5 C)] 97.6 F (36.4 C) (04/21 0347) Pulse Rate:  [84-128] 89 (04/21 0347) Resp:  [18] 18 (04/21 0347) BP: (99-134)/(58-85) 109/80 (04/21 0347) SpO2:  [97 %-100 %] 100 % (04/21 0347)  Physical Exam:  General: alert and no distress  Lungs: clear to auscultation bilaterally Breasts: normal appearance, no masses or tenderness Heart: regular rate and rhythm, S1, S2 normal, no murmur, click, rub or gallop Abdomen: soft, non-tender; bowel sounds normal; no masses,  no organomegaly Pelvis: Lochia: appropriate, Uterine Fundus: firm Extremities: DVT Evaluation: No evidence of DVT seen on physical exam. Negative Homan's sign. No cords or calf tenderness. No significant calf/ankle edema.   Recent Labs    11/15/19 0054 11/16/19 0702  HGB 13.2 9.8*  HCT 39.7 30.4*    Assessment/Plan: Doing well postpartum Breastfeeding, Lactation consult, Circumcision prior to discharge  Contraception undecided Anemia postpartum, asymptomatic.  Will treat with PO iron outpatient.     LOS: 1 day   Hildred Laser, MD [facility] 11/16/2019 7:53 AM

## 2019-11-16 NOTE — Lactation Note (Signed)
This note was copied from a baby's chart. Lactation Consultation Note  Patient Name: Melanie Stevenson [id number] Date: 11/16/2019 Reason for consult: Follow-up assessment  Upon entering room, baby was swaddled at moms feet. MOB felt good about the last feed and felt that pumping questions were answered last LC visit. FOB stepped in room and wanted to know what they should do when baby cues in sleep after finishing a feed. [employer] student said that they could place baby STS to entice baby to wake up and feed. [facility] student reassured them that it can be confusing but we want to watch the feeding cues and not the clock. FOB wanted to know how they would get to 8-12 feedings in 24 hours if baby slept most of the day. [facility] student assured them that the circumcision and bath can tire the baby out but baby has completed the expected amount of voids for the day and that is a good sign. [employer] [employer] student went over the expectations of pees and poops for the next few days as well as the cluster feeding expectations later on today. [facility] student encouraged the family that they are doing a great job so far.  MOB and FOB did not have any further question and felt comfortable with the information that was give. They know they can call out if they need assistance later on.  Maternal Data Formula Feeding for Exclusion: No Has patient been taught Hand Expression?: Yes Does the patient have breastfeeding experience prior to this delivery?: No  Feeding Feeding Type: Breast Fed  LATCH Score Latch: Grasps breast easily, tongue down, lips flanged, rhythmical sucking.  Audible Swallowing: A few with stimulation  Type of Nipple: Everted at rest and after stimulation  Comfort (Breast/Nipple): Soft / non-tender  Hold (Positioning): No assistance needed to correctly position infant at breast.  LATCH Score: 9  Interventions Interventions: Breast feeding basics reviewed;Breast compression;Skin to skin;Breast massage  Lactation Tools  Discussed/Used     Consult Status Consult Status: Follow-up Date: 11/17/19 Follow-up type: In-patient    Thersa Salt [facility] 11/16/2019, 2:43 PM

## 2019-11-16 NOTE — Lactation Note (Signed)
This note was copied from a baby's chart. Lactation Consultation Note  Patient Name: Melanie Stevenson Date: 11/16/2019 Reason for consult: Initial assessment;Mother's request;1st time breastfeeding;Term;Other (Comment)(LGA, IVF)  Parents called out to [facility] for observed feeding. Baby was slightly stirring in bassinet for first time since returning from circumcision. Before baby fully awoke LC educated parents on breastfeeding basics: newborn stomach size, feeding patterns for first 24 hours, output expectations, early hunger cues, and benefits of skin to skin. Dad unswaddled baby, removed shirt, and brought to mom for skin to skin prior to feed, LC observed dad's assist and provided praise. Mom had support pillows already available, and positioned for cross-cradle hold. Mom independently brought baby into cross-cradle hold and sandwiched tissue, baby easily grasped breast and began strong rhythmic sucking pattern. LC observed flanged top and bottom lip, praised mom for her positioning and alignment of baby, and provided tips for keeping baby actively feeding. Baby fed for 10 minutes with LC and dad's assistance to keep alert. Dad asked for ways to help while mom is breastfeeding, provided tips for bringing baby to mom, changing diaper, giving pillows, helping with positioning if needed, drinks/snacks while feeding, keeping baby alert, and taking baby post feeding for diaper change and/or skin to skin with him. Briefly discussed normal course of lactation, timing of transitional and mature milk. Reviewed milk supply and demand, concept of empty breasts make more milk. Praised parents for great tag-team effort in getting baby Melanie Stevenson fed. Melanie Stevenson was re-swaddled by dad, and soundly sleeping. LC encouraged parents to call out for [facility] assistance as needed today.  Maternal Data Formula Feeding for Exclusion: No Has patient been taught Hand Expression?: Yes Does the patient have breastfeeding experience  prior to this delivery?: No(first time mom)  Feeding Feeding Type: Breast Fed  LATCH Score Latch: Grasps breast easily, tongue down, lips flanged, rhythmical sucking.  Audible Swallowing: A few with stimulation  Type of Nipple: Everted at rest and after stimulation  Comfort (Breast/Nipple): Soft / non-tender  Hold (Positioning): No assistance needed to correctly position infant at breast.  LATCH Score: 9  Interventions Interventions: Breast feeding basics reviewed;Hand express  Lactation Tools Discussed/Used     Consult Status Consult Status: PRN    Danford Bad 11/16/2019, 12:27 PM

## 2019-11-17 DIAGNOSIS — O9081 Anemia of the puerperium: Secondary | ICD-10-CM | POA: Diagnosis not present

## 2019-11-17 MED ORDER — IBUPROFEN 800 MG PO TABS: 800.0000 mg | ORAL_TABLET | Freq: Three times a day (TID) | ORAL | 1 refills | Status: DC | PRN

## 2019-11-17 MED ORDER — FERROUS SULFATE 325 (65 FE) MG PO TABS: 325.0000 mg | ORAL_TABLET | Freq: Every day | ORAL | 0 refills | Status: DC

## 2019-11-17 MED ORDER — DOCUSATE SODIUM 100 MG PO CAPS: 100.0000 mg | ORAL_CAPSULE | Freq: Two times a day (BID) | ORAL | 2 refills | Status: DC | PRN

## 2019-11-17 NOTE — Progress Notes (Signed)
Discharge order received from doctor. Reviewed discharge instructions and prescriptions with patient and answered all questions. Follow up appointment instructions given. Patient verbalized understanding. ID bands checked. Patient discharged home with infant via wheelchair by nursing/auxillary.    Callan Yontz Garner, RN  

## 2019-11-17 NOTE — Lactation Note (Signed)
This note was copied from a baby's chart. Lactation Consultation Note  Patient Name: Melanie Stevenson [id number] Date: 11/17/2019     Maternal Data    Feeding    LATCH Score                   Interventions    Lactation Tools Discussed/Used     Consult Status  Lactation consultant initially spoke with parents about progress with breastfeeding. Mother states that infant is cluster-feeding. Upon assessment, infant's latch appeared to  look good but very little swallows were heard. Parents were initially concerned that infant was not getting enough because he is fussier today and he had not had a stool today. Parents state that they both had to have speech therapy as a child with orthodontic work. LC did oral assessment of infant's mouth and suck. Infant appears to have a tight upper frenulum that wraps under the upper gum causing an indentation in the middle of the upper gums. LC is unsure of milk transfer and suggested that mother pump and give infant expressed milk to ensure that infant is getting enough colostrum. Mother became very emotional and concerned that the introduction of a pump would interfere with her and infant's bonding time. She does not want to introduce a breast pump at this time. LC explained pumping would be temporary to establish her milk supply and that she could still meet her breastfeeding goals but also wanted to be respectful of mother's decision. LC educated parents on how to tell when infant is getting enough as well as signs and symptoms to watch out for. Parents will continue with their plan of exclusively breastfeeding infant.     Arlyss Gandy 11/17/2019, 4:12 PM

## 2019-11-17 NOTE — Discharge Instructions (Signed)

## 2019-11-17 NOTE — Discharge Summary (Signed)
OB Discharge Summary     Patient Name: Melanie Stevenson DOB: 1980/09/12 MRN: [id number]  Date of admission: 11/15/2019 Delivering MD: Hildred Laser   Date of discharge: 11/17/2019  Admitting diagnosis: Post-dates pregnancy [O48.0] Intrauterine pregnancy: [redacted]w[redacted]d     Secondary diagnosis:  Active Problems: Patient Active Problem List   Diagnosis Date Noted  . Post-dates pregnancy 11/15/2019  . Pregnancy conceived through in vitro fertilization 05/13/2019  . History of LEEP (loop electrosurgical excision procedure) of cervix complicating pregnancy 05/13/2019  . Supervision of high risk elderly multigravida in second trimester 05/13/2019  . Overweight (BMI 25.0-29.9) 10/06/2017     Additional problems:None     Discharge diagnosis: Term Pregnancy Delivered                                                                                                Post partum procedures:None  Augmentation: Cytotec  Complications: None  Hospital course:  Induction of Labor With Vaginal Delivery   39 y.o. yo G1P1001 at [redacted]w[redacted]d was admitted to the hospital 11/15/2019 for induction of labor.  Indication for induction: Postdates.  Patient had an uncomplicated labor course as follows: Membrane Rupture Time/Date: 12:00 PM ,11/15/2019   Intrapartum Procedures: Episiotomy: None [1]                                         Lacerations:  2nd degree [3]  Patient had delivery of a Viable infant.  Information for the patient's newborn:  Kiandria, Clum [[id number]]  Delivery Method: Vag-Spont    11/15/2019  Details of delivery can be found in separate delivery note.  Patient had a routine postpartum course. Patient is discharged home 11/17/19.   Physical exam  Vitals:   11/16/19 1120 11/16/19 1756 11/16/19 2312 11/17/19 0749  BP: 100/70 111/73 103/70 114/75  Pulse: 97 100 (!) 107 98  Resp: 18 20 18 18   Temp: 98.6 F (37 C) 98 F (36.7 C) 97.8 F (36.6 C) 97.7 F (36.5 C)  TempSrc: Axillary Oral Oral  Oral  SpO2: 98% 98% 97% 98%  Weight:      Height:       General: alert and no distress Lochia: appropriate Uterine Fundus: firm Incision: N/A DVT Evaluation: No evidence of DVT seen on physical exam. Negative Homan's sign. No cords or calf tenderness. No significant calf/ankle edema.   Labs: Lab Results  Component Value Date   WBC 11.4 (H) 11/16/2019   HGB 9.8 (L) 11/16/2019   HCT 30.4 (L) 11/16/2019   MCV 94.4 11/16/2019   PLT 158 11/16/2019    CMP Latest Ref Rng & Units 10/06/2017  Glucose 65 - 99 mg/dL 12/06/2017)  BUN 6 - 20 mg/dL 11  Creatinine 188(C - 1.66 mg/dL 0.63  Sodium 0.16 - 010 mmol/L 140  Potassium 3.5 - 5.2 mmol/L 4.5  Chloride 96 - 106 mmol/L 102  CO2 20 - 29 mmol/L 19(L)  Calcium 8.7 - 10.2 mg/dL 9.4  Total Protein 6.0 - 8.5 g/dL 7.0  Total Bilirubin 0.0 - 1.2 mg/dL 0.3  Alkaline Phos 39 - 117 IU/L 58  AST 0 - 40 IU/L 19  ALT 0 - 32 IU/L 28    Discharge instruction: per After Visit Summary.   After visit meds:  Allergies as of 11/17/2019      Reactions   Codeine    Other reaction(s): Vomiting      Medication List    STOP taking these medications   aspirin EC 81 MG tablet   folic acid 315 MCG tablet Commonly known as: FOLVITE   omeprazole 10 MG capsule Commonly known as: PRILOSEC   OVASITOL PO   valACYclovir 500 MG tablet Commonly known as: VALTREX     TAKE these medications   docusate sodium 100 MG capsule Commonly known as: COLACE Take 1 capsule (100 mg total) by mouth 2 (two) times daily as needed for mild constipation.   ferrous sulfate 325 (65 FE) MG tablet Commonly known as: FerrouSul Take 1 tablet (325 mg total) by mouth daily with breakfast.   ibuprofen 800 MG tablet Commonly known as: ADVIL Take 1 tablet (800 mg total) by mouth every 8 (eight) hours as needed.   loratadine 10 MG tablet Commonly known as: CLARITIN Take 10 mg by mouth daily.   multivitamin-prenatal 27-0.8 MG Tabs tablet Take 1 tablet by mouth daily at 12  noon.   Probiotic Colon Support Caps Take by mouth.       Diet: routine diet  Activity: Advance as tolerated. Pelvic rest for 6 weeks.   Outpatient follow up:6 weeks Follow up Appt:No future appointments. Follow up Visit:No follow-ups on file.  Postpartum contraception: Undecided, possibly condoms  Newborn Data: Live born female  Birth Weight: 9 lb 1 oz (4110 g) APGAR: 56, 9  Newborn Delivery   Birth date/time: 11/15/2019 17:21:00 Delivery type: Vaginal, Spontaneous      Baby Feeding: Breast Disposition:home with mother   11/17/2019 Rubie Maid, MD  [facility]

## 2019-12-01 ENCOUNTER — Other Ambulatory Visit: Payer: Self-pay

## 2019-12-01 ENCOUNTER — Ambulatory Visit: Payer: BC Managed Care – PPO | Admitting: Obstetrics and Gynecology

## 2019-12-01 ENCOUNTER — Encounter: Payer: Self-pay | Admitting: Obstetrics and Gynecology

## 2019-12-01 VITALS — Ht 67.0 in | Wt 173.0 lb

## 2019-12-01 DIAGNOSIS — Z8659 Personal history of other mental and behavioral disorders: Secondary | ICD-10-CM

## 2019-12-01 NOTE — Progress Notes (Signed)
Virtual Visit via Telephone Note  I connected with Melanie Stevenson on 12/01/19 at 11:45 AM EDT by telephone and verified that I am speaking with the correct person using two identifiers.   I discussed the limitations, risks, security and privacy concerns of performing an evaluation and management service by telephone and the availability of in person appointments. I also discussed with the patient that there may be a patient responsible charge related to this service. The patient expressed understanding and agreed to proceed.   History of Present Illness: Melanie Stevenson is a 39 y.o. G61P1001 female who is ~ 2 weeks postpartum s/p NSVD. She has a prior history of anxiety.  Presents today for mood screening postpartum.   Patient reports that her anxiety is a little more elevated as she has had some issues with breastfeeding.  She notes that her baby was not gaining appropriate weight, and that she has now had to start supplementing with formula. Has been seen by a lactation consultant several times. Notes that this is not something that she desired, however is working to come to terms with it. Has been doing some pumping but does not like to pump.   Edinburgh Postnatal Depression Scale - 12/01/19 1148      Edinburgh Postnatal Depression Scale:  In the Past 7 Days   I have been able to laugh and see the funny side of things.  0    I have looked forward with enjoyment to things.  0    I have blamed myself unnecessarily when things went wrong.  2    I have been anxious or worried for no good reason.  2    I have felt scared or panicky for no good reason.  1    Things have been getting on top of me.  1    I have been so unhappy that I have had difficulty sleeping.  0    I have felt sad or miserable.  1    I have been so unhappy that I have been crying.  1    The thought of harming myself has occurred to me.  0    Edinburgh Postnatal Depression Scale Total  8       GAD 7 : Generalized Anxiety  Score 12/01/2019  Nervous, Anxious, on Edge 3  Control/stop worrying 3  Worry too much - different things 3  Trouble relaxing 2  Restless 0  Easily annoyed or irritable 0  Afraid - awful might happen 2  Total GAD 7 Score 13  Anxiety Difficulty Not difficult at all      Observations/Objective: Height 5\' 7"  (1.702 m), weight 173 lb (78.5 kg), currently breastfeeding. Gen app: sounds fatigued, no apparent acute distress Psych: normal speech, normal affect  Assessment and Plan: 1. Perinatal anxiety - Discussed options of counseling, natural remedies, and medications with patient.  Declines counseling at this time and medications. Is interested in natural remedies (advised on SAM-E).  2. Postpartum state - - Postpartum state, patient to f/u in ~ 3-4 weeks for final postpartum visit. Will reassess symptoms at that time.  Difficulties with breastfeeding - Discussed difficulties with breastfeeding, advised on additional tips for feeding and self-care.   Follow Up Instructions:   I discussed the assessment and treatment plan with the patient. The patient was provided an opportunity to ask questions and all were answered. The patient agreed with the plan and demonstrated an understanding of the instructions.   The patient was  advised to call back or seek an in-person evaluation if the symptoms worsen or if the condition fails to improve as anticipated.  I provided 14 minutes of non-face-to-face time during this encounter.   Hildred Laser, MD

## 2019-12-01 NOTE — Progress Notes (Signed)
Televisit-Pt called. Medication and personal history updated. Pt having televisit due to having increase in anxiety. Pt is currently postpartum. PHQ-9=13. EPDS=8

## 2019-12-20 ENCOUNTER — Encounter: Payer: Self-pay | Admitting: Obstetrics and Gynecology

## 2019-12-20 ENCOUNTER — Ambulatory Visit (INDEPENDENT_AMBULATORY_CARE_PROVIDER_SITE_OTHER): Payer: BC Managed Care – PPO | Admitting: Obstetrics and Gynecology

## 2019-12-20 ENCOUNTER — Other Ambulatory Visit: Payer: Self-pay

## 2019-12-20 DIAGNOSIS — F53 Postpartum depression: Secondary | ICD-10-CM

## 2019-12-20 DIAGNOSIS — O9081 Anemia of the puerperium: Secondary | ICD-10-CM

## 2019-12-20 DIAGNOSIS — Z8659 Personal history of other mental and behavioral disorders: Secondary | ICD-10-CM

## 2019-12-20 DIAGNOSIS — O99345 Other mental disorders complicating the puerperium: Secondary | ICD-10-CM

## 2019-12-20 NOTE — Progress Notes (Signed)
OBSTETRICS POSTPARTUM CLINIC PROGRESS NOTE  Subjective:     Melanie Stevenson is a 39 y.o. G14P1001 female who presents for a postpartum visit. She is 6 weeks postpartum following a spontaneous vaginal delivery. I have fully reviewed the prenatal and intrapartum course. Pregnancy was via IVF. The delivery was at 41 gestational weeks.  Anesthesia: epidural. Postpartum course has been well. Baby's course has been well. Baby is feeding by breast and  bottle - Up and [employer]. Bleeding: patient has not resumed menses, with No LMP recorded. Bowel function is normal. Bladder function is normal. Patient is not sexually active. Contraception method desired is undecided. Postpartum depression screening: positive, EPDS = 10.  GAD screen positive, score is 9.    Edinburgh Postnatal Depression Scale Screening Tool 12/20/2019 12/01/2019 11/17/2019 11/16/2019 11/16/2019  I have been able to laugh and see the funny side of things. 0 0 (No Data) (No Data) (No Data)  I have looked forward with enjoyment to things. 0 0 - - -  I have blamed myself unnecessarily when things went wrong. 3 2 - - -  I have been anxious or worried for no good reason. 2 2 - - -  I have felt scared or panicky for no good reason. 2 1 - - -  Things have been getting on top of me. 1 1 - - -  I have been so unhappy that I have had difficulty sleeping. 0 0 - - -  I have felt sad or miserable. 1 1 - - -  I have been so unhappy that I have been crying. 1 1 - - -  The thought of harming myself has occurred to me. 0 0 - - -  Edinburgh Postnatal Depression Scale Total 10 8 - - -     GAD 7 : Generalized Anxiety Score 12/20/2019 12/01/2019  Nervous, Anxious, on Edge 2 3  Control/stop worrying 2 3  Worry too much - different things 2 3  Trouble relaxing 0 2  Restless 0 0  Easily annoyed or irritable 0 0  Afraid - awful might happen 3 2  Total GAD 7 Score 9 13  Anxiety Difficulty Not difficult at all Not difficult at all      The following  portions of the patient's history were reviewed and updated as appropriate: allergies, current medications, past family history, past medical history, past social history, past surgical history and problem list.  Review of Systems Pertinent items noted in HPI and remainder of comprehensive ROS otherwise negative.   Objective:    BP 118/79   Pulse 96   Ht 5\' 7"  (1.702 m)   Wt 175 lb 3.2 oz (79.5 kg)   Breastfeeding Yes   BMI 27.44 kg/m   General:  alert and no distress   Breasts:  inspection negative, no nipple discharge or bleeding, no masses or nodularity palpable  Lungs: clear to auscultation bilaterally  Heart:  regular rate and rhythm, S1, S2 normal, no murmur, click, rub or gallop  Abdomen: soft, non-tender; bowel sounds normal; no masses,  no organomegaly.     Vulva:  normal  Vagina: normal vagina, no discharge, exudate, lesion, or erythema. Perineum with small stitch present, cut down. Granulation tissue present.   Cervix:  no cervical motion tenderness and no lesions  Corpus: normal size, contour, position, consistency, mobility, non-tender  Adnexa:  normal adnexa and no mass, fullness, tenderness  Rectal Exam: Not performed.  Labs:  Lab Results  Component Value Date   HGB 9.8 (L) 11/16/2019     Assessment:   1. Postpartum examination following vaginal delivery   2. History of anxiety   3. Postpartum depression   4. Postpartum anemia     Plan:    1. Contraception: undecided. May consider condoms or OCPs. Notes husband with fertility issues (low sperm count) as reason for IVF 2. Will check Hgb for h/o anemia.  3. Postpartum depression and anxiety, patient did not utilize natural remedies suggested at 2 week check. Notes she is trying to manage without use of medications. Overall score has improved since last visit. Continue to monitor.  4. Discussed perineal care. Refrain from sexual activity for an additional week.  5. Follow up in: 3 months for annual  exam as needed.     Rubie Maid, MD [facility]

## 2019-12-20 NOTE — Progress Notes (Signed)
  PT is present today for her postpartum visit. Pt stated that she is breastfeeding, formula feeding and have not had sexually intercourse recently. Pt stated that she would use condoms for birth control. EPDS= 10.  GAD-7=9.Pt stated that she is doing well no complaints.

## 2020-03-27 ENCOUNTER — Other Ambulatory Visit: Payer: Self-pay

## 2020-03-27 ENCOUNTER — Other Ambulatory Visit (HOSPITAL_COMMUNITY)
Admission: RE | Admit: 2020-03-27 | Discharge: 2020-03-27 | Disposition: A | Discharge status: Home / Self Care | Payer: BC Managed Care – PPO | Source: Ambulatory Visit | Attending: Obstetrics and Gynecology | Admitting: Obstetrics and Gynecology

## 2020-03-27 ENCOUNTER — Ambulatory Visit (INDEPENDENT_AMBULATORY_CARE_PROVIDER_SITE_OTHER): Payer: BC Managed Care – PPO | Admitting: Obstetrics and Gynecology

## 2020-03-27 ENCOUNTER — Encounter: Payer: Self-pay | Admitting: Obstetrics and Gynecology

## 2020-03-27 VITALS — BP 113/79 | HR 102 | Ht 67.0 in | Wt 182.6 lb

## 2020-03-27 DIAGNOSIS — Z124 Encounter for screening for malignant neoplasm of cervix: Secondary | ICD-10-CM

## 2020-03-27 DIAGNOSIS — Z01419 Encounter for gynecological examination (general) (routine) without abnormal findings: Secondary | ICD-10-CM

## 2020-03-27 NOTE — Patient Instructions (Signed)
Preventive Care 20-38 Years Old, Female Preventive care refers to visits with your health care provider and lifestyle choices that can promote health and wellness. This includes:  A yearly physical exam. This may also be called an annual well check.  Regular dental visits and eye exams.  Immunizations.  Screening for certain conditions.  Healthy lifestyle choices, such as eating a healthy diet, getting regular exercise, not using drugs or products that contain nicotine and tobacco, and limiting alcohol use. What can I expect for my preventive care visit? Physical exam Your health care provider will check your:  Height and weight. This may be used to calculate body mass index (BMI), which tells if you are at a healthy weight.  Heart rate and blood pressure.  Skin for abnormal spots. Counseling Your health care provider may ask you questions about your:  Alcohol, tobacco, and drug use.  Emotional well-being.  Home and relationship well-being.  Sexual activity.  Eating habits.  Work and work Statistician.  Method of birth control.  Menstrual cycle.  Pregnancy history. What immunizations do I need?  Influenza (flu) vaccine  This is recommended every year. Tetanus, diphtheria, and pertussis (Tdap) vaccine  You may need a Td booster every 10 years. Varicella (chickenpox) vaccine  You may need this if you have not been vaccinated. Human papillomavirus (HPV) vaccine  If recommended by your health care provider, you may need three doses over 6 months. Measles, mumps, and rubella (MMR) vaccine  You may need at least one dose of MMR. You may also need a second dose. Meningococcal conjugate (MenACWY) vaccine  One dose is recommended if you are age 75-21 years and a first-year college student living in a residence hall, or if you have one of several medical conditions. You may also need additional booster doses. Pneumococcal conjugate (PCV13) vaccine  You may need  this if you have certain conditions and were not previously vaccinated. Pneumococcal polysaccharide (PPSV23) vaccine  You may need one or two doses if you smoke cigarettes or if you have certain conditions. Hepatitis A vaccine  You may need this if you have certain conditions or if you travel or work in places where you may be exposed to hepatitis A. Hepatitis B vaccine  You may need this if you have certain conditions or if you travel or work in places where you may be exposed to hepatitis B. Haemophilus influenzae type b (Hib) vaccine  You may need this if you have certain conditions. You may receive vaccines as individual doses or as more than one vaccine together in one shot (combination vaccines). Talk with your health care provider about the risks and benefits of combination vaccines. What tests do I need?  Blood tests  Lipid and cholesterol levels. These may be checked every 5 years starting at age 33.  Hepatitis C test.  Hepatitis B test. Screening  Diabetes screening. This is done by checking your blood sugar (glucose) after you have not eaten for a while (fasting).  Sexually transmitted disease (STD) testing.  BRCA-related cancer screening. This may be done if you have a family history of breast, ovarian, tubal, or peritoneal cancers.  Pelvic exam and Pap test. This may be done every 3 years starting at age 76. Starting at age 102, this may be done every 5 years if you have a Pap test in combination with an HPV test. Talk with your health care provider about your test results, treatment options, and if necessary, the need for more tests.  Follow these instructions at home: Eating and drinking   Eat a diet that includes fresh fruits and vegetables, whole grains, lean protein, and low-fat dairy.  Take vitamin and mineral supplements as recommended by your health care provider.  Do not drink alcohol if: ? Your health care provider tells you not to drink. ? You are  pregnant, may be pregnant, or are planning to become pregnant.  If you drink alcohol: ? Limit how much you have to 0-1 drink a day. ? Be aware of how much alcohol is in your drink. In the [place] one drink equals one 12 oz bottle of beer (355 mL), one 5 oz glass of wine (148 mL), or one 1 oz glass of hard liquor (44 mL). Lifestyle  Take daily care of your teeth and gums.  Stay active. Exercise for at least 30 minutes on 5 or more days each week.  Do not use any products that contain nicotine or tobacco, such as cigarettes, e-cigarettes, and chewing tobacco. If you need help quitting, ask your health care provider.  If you are sexually active, practice safe sex. Use a condom or other form of birth control (contraception) in order to prevent pregnancy and STIs (sexually transmitted infections). If you plan to become pregnant, see your health care provider for a preconception visit. What's next?  Visit your health care provider once a year for a well check visit.  Ask your health care provider how often you should have your eyes and teeth checked.  Stay up to date on all vaccines. This information is not intended to replace advice given to you by your health care provider. Make sure you discuss any questions you have with your health care provider. Document Revised: 03/25/2018 Document Reviewed: 03/25/2018 Elsevier Patient Education  2020 [employer] Breast Self-Awareness Breast self-awareness is knowing how your breasts look and feel. Doing breast self-awareness is important. It allows you to catch a breast problem early while it is still small and can be treated. All women should do breast self-awareness, including women who have had breast implants. Tell your doctor if you notice a change in your breasts. What you need:  A mirror.  A well-lit room. How to do a breast self-exam A breast self-exam is one way to learn what is normal for your breasts and to check for changes. To do a  breast self-exam: Look for changes  1. Take off all the clothes above your waist. 2. Stand in front of a mirror in a room with good lighting. 3. Put your hands on your hips. 4. Push your hands down. 5. Look at your breasts and nipples in the mirror to see if one breast or nipple looks different from the other. Check to see if: ? The shape of one breast is different. ? The size of one breast is different. ? There are wrinkles, dips, and bumps in one breast and not the other. 6. Look at each breast for changes in the skin, such as: ? Redness. ? Scaly areas. 7. Look for changes in your nipples, such as: ? Liquid around the nipples. ? Bleeding. ? Dimpling. ? Redness. ? A change in where the nipples are. Feel for changes  1. Lie on your back on the floor. 2. Feel each breast. To do this, follow these steps: ? Pick a breast to feel. ? Put the arm closest to that breast above your head. ? Use your other arm to feel the nipple area of your breast. Feel   the area with the pads of your three middle fingers by making small circles with your fingers. For the first circle, press lightly. For the second circle, press harder. For the third circle, press even harder. ? Keep making circles with your fingers at the different pressures as you move down your breast. Stop when you feel your ribs. ? Move your fingers a little toward the center of your body. ? Start making circles with your fingers again, this time going up until you reach your collarbone. ? Keep making up-and-down circles until you reach your armpit. Remember to keep using the three pressures. ? Feel the other breast in the same way. 3. Sit or stand in the tub or shower. 4. With soapy water on your skin, feel each breast the same way you did in step 2 when you were lying on the floor. Write down what you find Writing down what you find can help you remember what to tell your doctor. Write down:  What is normal for each breast.  Any  changes you find in each breast, including: ? The kind of changes you find. ? Whether you have pain. ? Size and location of any lumps.  When you last had your menstrual period. General tips  Check your breasts every month.  If you are breastfeeding, the best time to check your breasts is after you feed your baby or after you use a breast pump.  If you get menstrual periods, the best time to check your breasts is 5-7 days after your menstrual period is over.  With time, you will become comfortable with the self-exam, and you will begin to know if there are changes in your breasts. Contact a doctor if you:  See a change in the shape or size of your breasts or nipples.  See a change in the skin of your breast or nipples, such as red or scaly skin.  Have fluid coming from your nipples that is not normal.  Find a lump or thick area that was not there before.  Have pain in your breasts.  Have any concerns about your breast health. Summary  Breast self-awareness includes looking for changes in your breasts, as well as feeling for changes within your breasts.  Breast self-awareness should be done in front of a mirror in a well-lit room.  You should check your breasts every month. If you get menstrual periods, the best time to check your breasts is 5-7 days after your menstrual period is over.  Let your doctor know of any changes you see in your breasts, including changes in size, changes on the skin, pain or tenderness, or fluid from your nipples that is not normal. This information is not intended to replace advice given to you by your health care provider. Make sure you discuss any questions you have with your health care provider. Document Revised: 03/02/2018 Document Reviewed: 03/02/2018 Elsevier Patient Education  2020 [employer]  

## 2020-03-27 NOTE — Progress Notes (Signed)
GYNECOLOGY ANNUAL PHYSICAL EXAM PROGRESS NOTE  Subjective:    Melanie Stevenson is a 39 y.o. G49P1001 female who presents for an annual exam. The patient has no complaints today. The patient is sexually active.  The patient wears seatbelts: yes. The patient participates in regular exercise: no. Has the patient ever been transfused or tattooed?: no.   Reports that she would like to discuss birth control options.  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  Cycles are also somewhat heavy, but is still manageable.    Menstrual History  Patient's last menstrual period was 03/22/2020. Period Duration (Days): 7 Period Pattern: (!) Irregular Menstrual Flow: Heavy Menstrual Control: Maxi pad, Tampon Menstrual Control Change Freq (Hours): 3-4 Dysmenorrhea: (!) Mild Dysmenorrhea Symptoms: Cramping   Gynecologic History  History of STI's: Denies Last Pap: 10/02/2016. Results were: normal.  Reports remote h/o abnormal pap smears, has history of LEEP procedure. Contraception: Female condoms    Upstream - 03/27/20 1409      Pregnancy Intention Screening   Does the patient want to become pregnant in the next year? Ok Either Way    Does the patient's partner want to become pregnant in the next year? Ok Either Way    Would the patient like to discuss contraceptive options today? Yes      Contraception Wrap Up   Current Method Female Condom          The pregnancy intention screening data noted above was reviewed. Potential methods of contraception were discussed. The patient desires to think over options but considering Lo-Loestrin vs Phexxi.     OB History  Gravida Para Term Preterm AB Living  1 1 1  0 0 1  SAB TAB Ectopic Multiple Live Births  0 0 0 0 1    # Outcome Date GA Lbr Len/2nd Weight Sex Delivery Anes PTL Lv  1 Term 11/15/19 [redacted]w[redacted]d 02:49 / 02:02 9 lb 1 oz  (4.11 kg) M Vag-Spont EPI  LIV     Name: Schubach,BOY Kandee     Apgar1: 8  Apgar5: 9    Past Medical History:  Diagnosis Date  . Abnormal Pap smear of cervix   . Anxiety   . Depression   . HPV (human papilloma virus) infection   . HSV infection     Past Surgical History:  Procedure Laterality Date  . CERVICAL BIOPSY  W/ LOOP ELECTRODE EXCISION    . KNEE SURGERY Right     Family History  Problem Relation Age of Onset  . Healthy Mother   . Healthy Father   . Breast cancer Neg Hx   . Ovarian cancer Neg Hx   . Cervical cancer Neg Hx     Social History   Socioeconomic History  . Marital status: Married    Spouse name: Not on file  . Number of children: Not on file  . Years of education: Not on file  . Highest education level: Not on file  Occupational History  . Not on file  Tobacco Use  . Smoking status: Never Smoker  . Smokeless tobacco: Never Used  Vaping Use  . Vaping Use: Never used  Substance and Sexual Activity  . Alcohol use: Yes    Comment: Few beers a week   . Drug use: No  . Sexual activity: Yes    Birth control/protection: Condom, Other-see comments    Comment:  undecided  Other Topics Concern  . Not on file  Social History Narrative  . Not on file   Social Determinants of Health   Financial Resource Strain:   . Difficulty of Paying Living Expenses: Not on file  Food Insecurity:   . Worried About Programme researcher, broadcasting/film/video in the Last Year: Not on file  . Ran Out of Food in the Last Year: Not on file  Transportation Needs:   . Lack of Transportation (Medical): Not on file  . Lack of Transportation (Non-Medical): Not on file  Physical Activity:   . Days of Exercise per Week: Not on file  . Minutes of Exercise per Session: Not on file  Stress:   . Feeling of Stress : Not on file  Social Connections:   . Frequency of Communication with Friends and Family: Not on file  . Frequency of Social Gatherings with Friends and Family: Not on file  . Attends  Religious Services: Not on file  . Active Member of Clubs or Organizations: Not on file  . Attends Banker Meetings: Not on file  . Marital Status: Not on file  Intimate Partner Violence:   . Fear of Current or Ex-Partner: Not on file  . Emotionally Abused: Not on file  . Physically Abused: Not on file  . Sexually Abused: Not on file    Current Outpatient Medications on File Prior to Visit  Medication Sig Dispense Refill  . loratadine (CLARITIN) 10 MG tablet Take 10 mg by mouth daily.    . Prenatal Vit-Fe Fumarate-FA (MULTIVITAMIN-PRENATAL) 27-0.8 MG TABS tablet Take 1 tablet by mouth daily at 12 noon.    . Probiotic Product (PROBIOTIC COLON SUPPORT) CAPS Take by mouth.     No current facility-administered medications on file prior to visit.    Allergies  Allergen Reactions  . Codeine     Other reaction(s): Vomiting     Review of Systems Constitutional: negative for chills, fatigue, fevers and sweats Eyes: negative for irritation, redness and visual disturbance Ears, nose, mouth, throat, and face: negative for hearing loss, nasal congestion, snoring and tinnitus Respiratory: negative for asthma, cough, sputum Cardiovascular: negative for chest pain, dyspnea, exertional chest pressure/discomfort, irregular heart beat, palpitations and syncope Gastrointestinal: negative for abdominal pain, change in bowel habits, nausea and vomiting Genitourinary: negative for abnormal menstrual periods, genital lesions, sexual problems and vaginal discharge, dysuria and urinary incontinence Integument/breast: negative for breast lump, breast tenderness and nipple discharge Hematologic/lymphatic: negative for bleeding and easy bruising Musculoskeletal:negative for back pain and muscle weakness Neurological: negative for dizziness, headaches, vertigo and weakness Endocrine: negative for diabetic symptoms including polydipsia, polyuria and skin dryness Allergic/Immunologic: negative  for hay fever and urticaria      Objective:  Blood pressure 113/79, pulse (!) 102, height 5\' 7"  (1.702 m), weight 182 lb 9.6 oz (82.8 kg), last menstrual period 03/22/2020, currently breastfeeding. Body mass index is 28.6 kg/m.  General Appearance:    Alert, cooperative, no distress, appears stated age, overweight  Head:    Normocephalic, without obvious abnormality, atraumatic  Eyes:    PERRL, conjunctiva/corneas clear, EOM's intact, both eyes  Ears:    Normal external ear canals, both ears  Nose:   Nares normal, septum midline, mucosa normal, no drainage or sinus tenderness  Throat:   Lips, mucosa, and tongue normal; teeth and gums normal  Neck:   Supple, symmetrical, trachea midline, no adenopathy; thyroid: no enlargement/tenderness/nodules; no carotid bruit or JVD  Back:  Symmetric, no curvature, ROM normal, no CVA tenderness  Lungs:     Clear to auscultation bilaterally, respirations unlabored  Chest Wall:    No tenderness or deformity   Heart:    Regular rate and rhythm, S1 and S2 normal, no murmur, rub or gallop  Breast Exam:    No tenderness, masses, or nipple abnormality  Abdomen:     Soft, non-tender, bowel sounds active all four quadrants, no masses, no organomegaly.    Genitalia:    Pelvic:external genitalia normal, vagina without lesions, discharge, or tenderness, rectovaginal septum  normal. Cervix normal in appearance, no cervical motion tenderness, no adnexal masses or tenderness.  Uterus normal size, shape, mobile, regular contours, nontender.  Rectal:    Normal external sphincter.  No hemorrhoids appreciated. Internal exam not done.   Extremities:   Extremities normal, atraumatic, no cyanosis or edema  Pulses:   2+ and symmetric all extremities  Skin:   Skin color, texture, turgor normal, no rashes or lesions  Lymph nodes:   Cervical, supraclavicular, and axillary nodes normal  Neurologic:   CNII-XII intact, normal strength, sensation and reflexes throughout    .  Labs:  Lab Results  Component Value Date   WBC 11.4 (H) 11/16/2019   HGB 9.8 (L) 11/16/2019   HCT 30.4 (L) 11/16/2019   MCV 94.4 11/16/2019   PLT 158 11/16/2019    Lab Results  Component Value Date   CREATININE 0.78 10/06/2017   BUN 11 10/06/2017   NA 140 10/06/2017   K 4.5 10/06/2017   CL 102 10/06/2017   CO2 19 (L) 10/06/2017    Lab Results  Component Value Date   ALT 28 10/06/2017   AST 19 10/06/2017   ALKPHOS 58 10/06/2017   BILITOT 0.3 10/06/2017    Lab Results  Component Value Date   TSH 2.360 10/06/2017     Assessment:   Healthy female exam. Overweight Contraception counseling  Plan:    Blood tests: see orders. Breast self exam technique reviewed and patient encouraged to perform self-exam monthly. Contraception: condoms currently. Discussed contraception options at patient's request. Thinking about Lo-Loestrin vs Phexxi. Given handouts and samples. To notify MD if prescription is desired.  Discussed healthy lifestyle modifications. Pap smear performed today.   COVID vaccination status: vaccination series completed.  Follow up in 1 year for annual exam.    Hildred Laser, MD [facility]

## 2020-03-27 NOTE — Progress Notes (Signed)
Pt present for annual exam. Pt's last pap was 10/06/16. Pt stated that she was doing well at this time. Would like to discuss birth control options.

## 2020-03-28 LAB — LIPID PANEL
Chol/HDL Ratio: 3.9 ratio (ref 0.0–4.4)
Cholesterol, Total: 202 mg/dL — ABNORMAL HIGH (ref 100–199)
HDL: 52 mg/dL (ref >39)
LDL Chol Calc (NIH): 112 mg/dL — ABNORMAL HIGH (ref 0–99)
Triglycerides: 219 mg/dL — ABNORMAL HIGH (ref 0–149)
VLDL Cholesterol Cal: 38 mg/dL (ref 5–40)

## 2020-03-28 LAB — COMPREHENSIVE METABOLIC PANEL
ALT: 21 IU/L (ref 0–32)
AST: 19 IU/L (ref 0–40)
Albumin/Globulin Ratio: 1.8 (ref 1.2–2.2)
Albumin: 4.4 g/dL (ref 3.8–4.8)
Alkaline Phosphatase: 77 IU/L (ref 48–121)
BUN/Creatinine Ratio: 13 (ref 9–23)
BUN: 10 mg/dL (ref 6–20)
Bilirubin Total: 0.3 mg/dL (ref 0.0–1.2)
CO2: 23 mmol/L (ref 20–29)
Calcium: 9.5 mg/dL (ref 8.7–10.2)
Chloride: 103 mmol/L (ref 96–106)
Creatinine, Ser: 0.75 mg/dL (ref 0.57–1.00)
GFR calc Af Amer: 117 mL/min/{1.73_m2} (ref >59)
GFR calc non Af Amer: 101 mL/min/{1.73_m2} (ref >59)
Globulin, Total: 2.5 g/dL (ref 1.5–4.5)
Glucose: 87 mg/dL (ref 65–99)
Potassium: 4.5 mmol/L (ref 3.5–5.2)
Sodium: 142 mmol/L (ref 134–144)
Total Protein: 6.9 g/dL (ref 6.0–8.5)

## 2020-03-28 LAB — HEMOGLOBIN A1C
Est. average glucose Bld gHb Est-mCnc: 108 mg/dL
Hgb A1c MFr Bld: 5.4 % (ref 4.8–5.6)

## 2020-03-28 LAB — CBC
Hematocrit: 44.1 % (ref 34.0–46.6)
Hemoglobin: 15.1 g/dL (ref 11.1–15.9)
MCH: 29.6 pg (ref 26.6–33.0)
MCHC: 34.2 g/dL (ref 31.5–35.7)
MCV: 87 fL (ref 79–97)
Platelets: 313 10*3/uL (ref 150–450)
RBC: 5.1 x10E6/uL (ref 3.77–5.28)
RDW: 12.7 % (ref 11.7–15.4)
WBC: 7.4 10*3/uL (ref 3.4–10.8)

## 2020-03-28 LAB — TSH: TSH: 2.13 u[IU]/mL (ref 0.450–4.500)

## 2020-03-30 LAB — CYTOLOGY - PAP
Comment: NEGATIVE
Diagnosis: NEGATIVE
High risk HPV: NEGATIVE

## 2020-04-01 ENCOUNTER — Encounter: Payer: Self-pay | Admitting: Obstetrics and Gynecology

## 2020-05-29 DIAGNOSIS — F419 Anxiety disorder, unspecified: Secondary | ICD-10-CM | POA: Diagnosis not present

## 2020-06-05 DIAGNOSIS — F419 Anxiety disorder, unspecified: Secondary | ICD-10-CM | POA: Diagnosis not present

## 2020-06-06 DIAGNOSIS — Z86018 Personal history of other benign neoplasm: Secondary | ICD-10-CM | POA: Diagnosis not present

## 2020-06-06 DIAGNOSIS — L578 Other skin changes due to chronic exposure to nonionizing radiation: Secondary | ICD-10-CM | POA: Diagnosis not present

## 2020-06-15 DIAGNOSIS — F419 Anxiety disorder, unspecified: Secondary | ICD-10-CM | POA: Diagnosis not present

## 2020-06-26 DIAGNOSIS — F419 Anxiety disorder, unspecified: Secondary | ICD-10-CM | POA: Diagnosis not present

## 2020-07-03 DIAGNOSIS — F419 Anxiety disorder, unspecified: Secondary | ICD-10-CM | POA: Diagnosis not present

## 2020-07-13 DIAGNOSIS — F419 Anxiety disorder, unspecified: Secondary | ICD-10-CM | POA: Diagnosis not present

## 2020-07-31 DIAGNOSIS — F419 Anxiety disorder, unspecified: Secondary | ICD-10-CM | POA: Diagnosis not present

## 2020-08-02 DIAGNOSIS — F422 Mixed obsessional thoughts and acts: Secondary | ICD-10-CM | POA: Diagnosis not present

## 2020-08-02 DIAGNOSIS — F5105 Insomnia due to other mental disorder: Secondary | ICD-10-CM | POA: Diagnosis not present

## 2020-08-02 DIAGNOSIS — F411 Generalized anxiety disorder: Secondary | ICD-10-CM | POA: Diagnosis not present

## 2020-08-07 DIAGNOSIS — F419 Anxiety disorder, unspecified: Secondary | ICD-10-CM | POA: Diagnosis not present

## 2020-08-11 ENCOUNTER — Other Ambulatory Visit: Payer: BC Managed Care – PPO

## 2020-08-14 ENCOUNTER — Other Ambulatory Visit: Payer: Self-pay

## 2020-08-14 DIAGNOSIS — F419 Anxiety disorder, unspecified: Secondary | ICD-10-CM | POA: Diagnosis not present

## 2020-08-24 DIAGNOSIS — F5105 Insomnia due to other mental disorder: Secondary | ICD-10-CM | POA: Diagnosis not present

## 2020-08-24 DIAGNOSIS — F411 Generalized anxiety disorder: Secondary | ICD-10-CM | POA: Diagnosis not present

## 2020-08-24 DIAGNOSIS — F422 Mixed obsessional thoughts and acts: Secondary | ICD-10-CM | POA: Diagnosis not present

## 2020-09-28 DIAGNOSIS — F422 Mixed obsessional thoughts and acts: Secondary | ICD-10-CM | POA: Diagnosis not present

## 2020-09-28 DIAGNOSIS — F5105 Insomnia due to other mental disorder: Secondary | ICD-10-CM | POA: Diagnosis not present

## 2020-09-28 DIAGNOSIS — F411 Generalized anxiety disorder: Secondary | ICD-10-CM | POA: Diagnosis not present

## 2020-11-02 DIAGNOSIS — F422 Mixed obsessional thoughts and acts: Secondary | ICD-10-CM | POA: Diagnosis not present

## 2020-11-02 DIAGNOSIS — F411 Generalized anxiety disorder: Secondary | ICD-10-CM | POA: Diagnosis not present

## 2020-11-02 DIAGNOSIS — F5105 Insomnia due to other mental disorder: Secondary | ICD-10-CM | POA: Diagnosis not present

## 2020-12-07 ENCOUNTER — Encounter: Payer: Self-pay | Admitting: Obstetrics and Gynecology

## 2020-12-07 DIAGNOSIS — F422 Mixed obsessional thoughts and acts: Secondary | ICD-10-CM | POA: Diagnosis not present

## 2020-12-07 DIAGNOSIS — F5105 Insomnia due to other mental disorder: Secondary | ICD-10-CM | POA: Diagnosis not present

## 2020-12-07 DIAGNOSIS — F411 Generalized anxiety disorder: Secondary | ICD-10-CM | POA: Diagnosis not present

## 2021-02-07 DIAGNOSIS — F5105 Insomnia due to other mental disorder: Secondary | ICD-10-CM | POA: Diagnosis not present

## 2021-02-07 DIAGNOSIS — F422 Mixed obsessional thoughts and acts: Secondary | ICD-10-CM | POA: Diagnosis not present

## 2021-02-07 DIAGNOSIS — F411 Generalized anxiety disorder: Secondary | ICD-10-CM | POA: Diagnosis not present

## 2021-03-29 ENCOUNTER — Encounter: Payer: BC Managed Care – PPO | Admitting: Obstetrics and Gynecology

## 2021-04-05 ENCOUNTER — Encounter: Payer: BC Managed Care – PPO | Admitting: Obstetrics and Gynecology

## 2021-04-12 ENCOUNTER — Ambulatory Visit (INDEPENDENT_AMBULATORY_CARE_PROVIDER_SITE_OTHER): Payer: BC Managed Care – PPO | Admitting: Obstetrics and Gynecology

## 2021-04-12 ENCOUNTER — Encounter: Payer: Self-pay | Admitting: Obstetrics and Gynecology

## 2021-04-12 ENCOUNTER — Other Ambulatory Visit: Payer: Self-pay

## 2021-04-12 VITALS — BP 112/77 | HR 111 | Resp 16 | Ht 67.5 in | Wt 198.7 lb

## 2021-04-12 DIAGNOSIS — E66811 Obesity, class 1: Secondary | ICD-10-CM

## 2021-04-12 DIAGNOSIS — Z01419 Encounter for gynecological examination (general) (routine) without abnormal findings: Secondary | ICD-10-CM | POA: Diagnosis not present

## 2021-04-12 DIAGNOSIS — Z1231 Encounter for screening mammogram for malignant neoplasm of breast: Secondary | ICD-10-CM

## 2021-04-12 DIAGNOSIS — Z131 Encounter for screening for diabetes mellitus: Secondary | ICD-10-CM | POA: Diagnosis not present

## 2021-04-12 DIAGNOSIS — Z23 Encounter for immunization: Secondary | ICD-10-CM | POA: Diagnosis not present

## 2021-04-12 DIAGNOSIS — Z13220 Encounter for screening for lipoid disorders: Secondary | ICD-10-CM | POA: Diagnosis not present

## 2021-04-12 DIAGNOSIS — F32A Depression, unspecified: Secondary | ICD-10-CM

## 2021-04-12 DIAGNOSIS — E669 Obesity, unspecified: Secondary | ICD-10-CM

## 2021-04-12 DIAGNOSIS — F419 Anxiety disorder, unspecified: Secondary | ICD-10-CM

## 2021-04-12 NOTE — Patient Instructions (Signed)
Preventive Care 21-39 Years Old, Female Preventive care refers to lifestyle choices and visits with your health care provider that can promote health and wellness. This includes: A yearly physical exam. This is also called an annual wellness visit. Regular dental and eye exams. Immunizations. Screening for certain conditions. Healthy lifestyle choices, such as: Eating a healthy diet. Getting regular exercise. Not using drugs or products that contain nicotine and tobacco. Limiting alcohol use. What can I expect for my preventive care visit? Physical exam Your health care provider may check your: Height and weight. These may be used to calculate your BMI (body mass index). BMI is a measurement that tells if you are at a healthy weight. Heart rate and blood pressure. Body temperature. Skin for abnormal spots. Counseling Your health care provider may ask you questions about your: Past medical problems. Family's medical history. Alcohol, tobacco, and drug use. Emotional well-being. Home life and relationship well-being. Sexual activity. Diet, exercise, and sleep habits. Work and work environment. Access to firearms. Method of birth control. Menstrual cycle. Pregnancy history. What immunizations do I need? Vaccines are usually given at various ages, according to a schedule. Your health care provider will recommend vaccines for you based on your age, medical history, and lifestyle or other factors, such as travel or where you work. What tests do I need? Blood tests Lipid and cholesterol levels. These may be checked every 5 years starting at age 20. Hepatitis C test. Hepatitis B test. Screening Diabetes screening. This is done by checking your blood sugar (glucose) after you have not eaten for a while (fasting). STD (sexually transmitted disease) testing, if you are at risk. BRCA-related cancer screening. This may be done if you have a family history of breast, ovarian, tubal, or  peritoneal cancers. Pelvic exam and Pap test. This may be done every 3 years starting at age 21. Starting at age 30, this may be done every 5 years if you have a Pap test in combination with an HPV test. Talk with your health care provider about your test results, treatment options, and if necessary, the need for more tests. Follow these instructions at home: Eating and drinking  Eat a healthy diet that includes fresh fruits and vegetables, whole grains, lean protein, and low-fat dairy products. Take vitamin and mineral supplements as recommended by your health care provider. Do not drink alcohol if: Your health care provider tells you not to drink. You are pregnant, may be pregnant, or are planning to become pregnant. If you drink alcohol: Limit how much you have to 0-1 drink a day. Be aware of how much alcohol is in your drink. In the [place] one drink equals one 12 oz bottle of beer (355 mL), one 5 oz glass of wine (148 mL), or one 1 oz glass of hard liquor (44 mL). Lifestyle Take daily care of your teeth and gums. Brush your teeth every morning and night with fluoride toothpaste. Floss one time each day. Stay active. Exercise for at least 30 minutes 5 or more days each week. Do not use any products that contain nicotine or tobacco, such as cigarettes, e-cigarettes, and chewing tobacco. If you need help quitting, ask your health care provider. Do not use drugs. If you are sexually active, practice safe sex. Use a condom or other form of protection to prevent STIs (sexually transmitted infections). If you do not wish to become pregnant, use a form of birth control. If you plan to become pregnant, see your health care provider   for a prepregnancy visit. Find healthy ways to cope with stress, such as: Meditation, yoga, or listening to music. Journaling. Talking to a trusted person. Spending time with friends and family. Safety Always wear your seat belt while driving or riding in a  vehicle. Do not drive: If you have been drinking alcohol. Do not ride with someone who has been drinking. When you are tired or distracted. While texting. Wear a helmet and other protective equipment during sports activities. If you have firearms in your house, make sure you follow all gun safety procedures. Seek help if you have been physically or sexually abused. What's next? Go to your health care provider once a year for an annual wellness visit. Ask your health care provider how often you should have your eyes and teeth checked. Stay up to date on all vaccines. This information is not intended to replace advice given to you by your health care provider. Make sure you discuss any questions you have with your health care provider. Document Revised: 09/21/2020 Document Reviewed: 03/25/2018 Elsevier Patient Education  2022 [place] Breast self-awareness means being familiar with how your breasts look and feel. It involves checking your breasts regularly and reporting any changes to your health care provider. Practicing breast self-awareness is important. Sometimes changes may not be harmful (are benign), but sometimes a change in your breasts can be a sign of a serious medical problem. It is important to learn how to do this procedure correctly so that you can catch problems early, when treatment is more likely to be successful. All women should practice breast self-awareness, including women who have had breast implants. What you need: A mirror. A well-lit room. How to do a breast self-exam A breast self-exam is one way to learn what is normal for your breasts and whether your breasts are changing. To do a breast self-exam: Look for changes  Remove all the clothing above your waist. Stand in front of a mirror in a room with good lighting. Put your hands on your hips. Push your hands firmly downward. Compare your breasts in the mirror. Look for  differences between them (asymmetry), such as: Differences in shape. Differences in size. Puckers, dips, and bumps in one breast and not the other. Look at each breast for changes in the skin, such as: Redness. Scaly areas. Look for changes in your nipples, such as: Discharge. Bleeding. Dimpling. Redness. A change in position. Feel for changes Carefully feel your breasts for lumps and changes. It is best to do this while lying on your back on the floor, and again while sitting or standing in the tub or shower with soapy water on your skin. Feel each breast in the following way: Place the arm on the side of the breast you are examining above your head. Feel your breast with the other hand. Start in the nipple area and make -inch (2 cm) overlapping circles to feel your breast. Use the pads of your three middle fingers to do this. Apply light pressure, then medium pressure, then firm pressure. The light pressure will allow you to feel the tissue closest to the skin. The medium pressure will allow you to feel the tissue that is a little deeper. The firm pressure will allow you to feel the tissue close to the ribs. Continue the overlapping circles, moving downward over the breast until you feel your ribs below your breast. Move one finger-width toward the center of the body. Continue to use  the -inch (2 cm) overlapping circles to feel your breast as you move slowly up toward your collarbone. Continue the up-and-down exam using all three pressures until you reach your armpit.  Write down what you find Writing down what you find can help you remember what to discuss with your health care provider. Write down: What is normal for each breast. Any changes that you find in each breast, including: The kind of changes you find. Any pain or tenderness. Size and location of any lumps. Where you are in your menstrual cycle, if you are still menstruating. General tips and recommendations Examine your  breasts every month. If you are breastfeeding, the best time to examine your breasts is after a feeding or after using a breast pump. If you menstruate, the best time to examine your breasts is 5-7 days after your period. Breasts are generally lumpier during menstrual periods, and it may be more difficult to notice changes. With time and practice, you will become more familiar with the variations in your breasts and more comfortable with the exam. Contact a health care provider if you: See a change in the shape or size of your breasts or nipples. See a change in the skin of your breast or nipples, such as a reddened or scaly area. Have unusual discharge from your nipples. Find a lump or thick area that was not there before. Have pain in your breasts. Have any concerns related to your breast health. Summary Breast self-awareness includes looking for physical changes in your breasts, as well as feeling for any changes within your breasts. Breast self-awareness should be performed in front of a mirror in a well-lit room. You should examine your breasts every month. If you menstruate, the best time to examine your breasts is 5-7 days after your menstrual period. Let your health care provider know of any changes you notice in your breasts, including changes in size, changes on the skin, pain or tenderness, or unusual fluid from your nipples. This information is not intended to replace advice given to you by your health care provider. Make sure you discuss any questions you have with your health care provider. Document Revised: 03/02/2018 Document Reviewed: 03/02/2018 Elsevier Patient Education  [place].

## 2021-04-12 NOTE — Progress Notes (Signed)
GYNECOLOGY ANNUAL PHYSICAL EXAM PROGRESS NOTE  Subjective:   Melanie Stevenson is a 40 y.o. G53P1001 female who presents for an annual exam. The patient has no complaints today. The patient is sexually active.  The patient wears seatbelts: yes. The patient participates in regular exercise: no. Has the patient ever been transfused or tattooed?: no.    Menstrual History: Menarche age: 55 Patient's last menstrual period was 04/04/2021 (exact date). Period Duration (Days): 7 Period Pattern: (!) Irregular Menstrual Flow: Heavy Menstrual Control: Maxi pad, Tampon Menstrual Control Change Freq (Hours): 3-4 Dysmenorrhea: (!) Mild Dysmenorrhea Symptoms: Cramping  Gynecologic History:  Contraception: none History of STI's: H/o HSV, HPV.  Last Pap: 03/27/2020. Results were: normal.  Reports remote h/o abnormal pap smears, has history of LEEP procedure Last mammogram: Not Applicable.   OB History  Gravida Para Term Preterm AB Living  1 1 1  0 0 1  SAB IAB Ectopic Multiple Live Births  0 0 0 0 1    # Outcome Date GA Lbr Len/2nd Weight Sex Delivery Anes PTL Lv  1 Term 11/15/19 [redacted]w[redacted]d 02:49 / 02:02 9 lb 1 oz (4.11 kg) M Vag-Spont EPI  LIV     Name: Petrosino,BOY Tarrie     Apgar1: 8  Apgar5: 9    Past Medical History:  Diagnosis Date   Abnormal Pap smear of cervix    Anxiety    Depression    HPV (human papilloma virus) infection    HSV infection     Past Surgical History:  Procedure Laterality Date   CERVICAL BIOPSY  W/ LOOP ELECTRODE EXCISION     KNEE SURGERY Right     Family History  Problem Relation Age of Onset   Healthy Mother    Healthy Father    Breast cancer Neg Hx    Ovarian cancer Neg Hx    Cervical cancer Neg Hx     Social History   Socioeconomic History   Marital status: Married    Spouse name: Not on file   Number of children: Not on file   Years of education: Not on file   Highest education level: Not on file  Occupational History   Not on file   Tobacco Use   Smoking status: Never   Smokeless tobacco: Never  Vaping Use   Vaping Use: Never used  Substance and Sexual Activity   Alcohol use: Yes    Comment: Few beers a week    Drug use: No   Sexual activity: Yes    Birth control/protection: Condom, Other-see comments    Comment: undecided  Other Topics Concern   Not on file  Social History Narrative   Not on file   Social Determinants of Health   Financial Resource Strain: Not on file  Food Insecurity: Not on file  Transportation Needs: Not on file  Physical Activity: Not on file  Stress: Not on file  Social Connections: Not on file  Intimate Partner Violence: Not on file    Current Outpatient Medications on File Prior to Visit  Medication Sig Dispense Refill   loratadine (CLARITIN) 10 MG tablet Take 10 mg by mouth daily.     Prenatal Vit-Fe Fumarate-FA (MULTIVITAMIN-PRENATAL) 27-0.8 MG TABS tablet Take 1 tablet by mouth daily at 12 noon.     Probiotic Product (PROBIOTIC COLON SUPPORT) CAPS Take by mouth.     No current facility-administered medications on file prior to visit.    Allergies  Allergen Reactions   Codeine  Other reaction(s): Vomiting     Review of Systems Constitutional: negative for chills, fatigue, fevers and sweats Eyes: negative for irritation, redness and visual disturbance Ears, nose, mouth, throat, and face: negative for hearing loss, nasal congestion, snoring and tinnitus Respiratory: negative for asthma, cough, sputum Cardiovascular: negative for chest pain, dyspnea, exertional chest pressure/discomfort, irregular heart beat, palpitations and syncope Gastrointestinal: negative for abdominal pain, change in bowel habits, nausea and vomiting Genitourinary: negative for abnormal menstrual periods, genital lesions, sexual problems and vaginal discharge, dysuria and urinary incontinence Integument/breast: negative for breast lump, breast tenderness and nipple  discharge Hematologic/lymphatic: negative for bleeding and easy bruising Musculoskeletal:negative for back pain and muscle weakness Neurological: negative for dizziness, headaches, vertigo and weakness Endocrine: negative for diabetic symptoms including polydipsia, polyuria and skin dryness Allergic/Immunologic: negative for hay fever and urticaria      Objective:   Blood pressure 112/77, pulse (!) 111, resp. rate 16, height 5' 7.5" (1.715 m), weight 198 lb 11.2 oz (90.1 kg), last menstrual period 04/04/2021, currently breastfeeding. Body mass index is 30.66 kg/m.  General Appearance:    Alert, cooperative, no distress, appears stated age, mild obesity.   Head:    Normocephalic, without obvious abnormality, atraumatic  Eyes:    PERRL, conjunctiva/corneas clear, EOM's intact, both eyes  Ears:    Normal external ear canals, both ears  Nose:   Nares normal, septum midline, mucosa normal, no drainage or sinus tenderness  Throat:   Lips, mucosa, and tongue normal; teeth and gums normal  Neck:   Supple, symmetrical, trachea midline, no adenopathy; thyroid: no enlargement/tenderness/nodules; no carotid bruit or JVD  Back:     Symmetric, no curvature, ROM normal, no CVA tenderness  Lungs:     Clear to auscultation bilaterally, respirations unlabored  Chest Wall:    No tenderness or deformity   Heart:    Regular rate and rhythm, S1 and S2 normal, no murmur, rub or gallop  Breast Exam:    No tenderness, masses, or nipple abnormality  Abdomen:     Soft, non-tender, bowel sounds active all four quadrants, no masses, no organomegaly.    Genitalia:    Pelvic:external genitalia normal, vagina without lesions, discharge, or tenderness, rectovaginal septum  normal. Cervix normal in appearance, no cervical motion tenderness, no adnexal masses or tenderness.  Uterus normal size, shape, mobile, regular contours, nontender.  Rectal:    Normal external sphincter.  No hemorrhoids appreciated. Internal exam not  done.   Extremities:   Extremities normal, atraumatic, no cyanosis or edema  Pulses:   2+ and symmetric all extremities  Skin:   Skin color, texture, turgor normal, no rashes or lesions  Lymph nodes:   Cervical, supraclavicular, and axillary nodes normal  Neurologic:   CNII-XII intact, normal strength, sensation and reflexes throughout   .  Labs:  Lab Results  Component Value Date   WBC 7.4 03/27/2020   HGB 15.1 03/27/2020   HCT 44.1 03/27/2020   MCV 87 03/27/2020   PLT 313 03/27/2020    Lab Results  Component Value Date   CREATININE 0.75 03/27/2020   BUN 10 03/27/2020   NA 142 03/27/2020   K 4.5 03/27/2020   CL 103 03/27/2020   CO2 23 03/27/2020    Lab Results  Component Value Date   ALT 21 03/27/2020   AST 19 03/27/2020   ALKPHOS 77 03/27/2020   BILITOT 0.3 03/27/2020    Lab Results  Component Value Date   TSH 2.130 03/27/2020  Assessment:   1. Encounter for well woman exam with routine gynecological exam   2. Screening cholesterol level   3. Screening for diabetes mellitus (DM)   4. Need for immunization against influenza   5. Breast cancer screening by mammogram   6. Obesity (BMI 30.0-34.9)   7. Anxiety and depression      Plan:  Blood tests: see orders. Breast self exam technique reviewed and patient encouraged to perform self-exam monthly. Contraception: condoms. Discussed healthy lifestyle modifications. 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  Mammogram: To begin screening at age 36. Order placed.   Pap smear  up to date . COVID vaccination status: declines Anxiety and depression managed on Prozac, also seeing a therapist. Follow up in 1 year for annual exam   Hildred Laser, MD [facility]

## 2021-04-13 LAB — CBC
Hematocrit: 42.1 % (ref 34.0–46.6)
Hemoglobin: 13.9 g/dL (ref 11.1–15.9)
MCH: 30.3 pg (ref 26.6–33.0)
MCHC: 33 g/dL (ref 31.5–35.7)
MCV: 92 fL (ref 79–97)
Platelets: 304 10*3/uL (ref 150–450)
RBC: 4.58 x10E6/uL (ref 3.77–5.28)
RDW: 12.1 % (ref 11.7–15.4)
WBC: 7.6 10*3/uL (ref 3.4–10.8)

## 2021-04-13 LAB — COMPREHENSIVE METABOLIC PANEL
ALT: 19 IU/L (ref 0–32)
AST: 19 IU/L (ref 0–40)
Albumin/Globulin Ratio: 1.7 (ref 1.2–2.2)
Albumin: 4.1 g/dL (ref 3.8–4.8)
Alkaline Phosphatase: 87 IU/L (ref 44–121)
BUN/Creatinine Ratio: 17 (ref 9–23)
BUN: 12 mg/dL (ref 6–20)
Bilirubin Total: 0.3 mg/dL (ref 0.0–1.2)
CO2: 23 mmol/L (ref 20–29)
Calcium: 8.9 mg/dL (ref 8.7–10.2)
Chloride: 102 mmol/L (ref 96–106)
Creatinine, Ser: 0.72 mg/dL (ref 0.57–1.00)
Globulin, Total: 2.4 g/dL (ref 1.5–4.5)
Glucose: 106 mg/dL — ABNORMAL HIGH (ref 65–99)
Potassium: 3.9 mmol/L (ref 3.5–5.2)
Sodium: 138 mmol/L (ref 134–144)
Total Protein: 6.5 g/dL (ref 6.0–8.5)
eGFR: 109 mL/min/{1.73_m2} (ref >59)

## 2021-04-13 LAB — LIPID PANEL
Chol/HDL Ratio: 3.5 ratio (ref 0.0–4.4)
Cholesterol, Total: 181 mg/dL (ref 100–199)
HDL: 52 mg/dL (ref >39)
LDL Chol Calc (NIH): 101 mg/dL — ABNORMAL HIGH (ref 0–99)
Triglycerides: 160 mg/dL — ABNORMAL HIGH (ref 0–149)
VLDL Cholesterol Cal: 28 mg/dL (ref 5–40)

## 2021-04-13 LAB — HEMOGLOBIN A1C
Est. average glucose Bld gHb Est-mCnc: 105 mg/dL
Hgb A1c MFr Bld: 5.3 % (ref 4.8–5.6)

## 2021-04-17 DIAGNOSIS — J0110 Acute frontal sinusitis, unspecified: Secondary | ICD-10-CM | POA: Diagnosis not present

## 2021-05-09 DIAGNOSIS — F5105 Insomnia due to other mental disorder: Secondary | ICD-10-CM | POA: Diagnosis not present

## 2021-05-09 DIAGNOSIS — F411 Generalized anxiety disorder: Secondary | ICD-10-CM | POA: Diagnosis not present

## 2021-05-09 DIAGNOSIS — F422 Mixed obsessional thoughts and acts: Secondary | ICD-10-CM | POA: Diagnosis not present

## 2021-06-17 DIAGNOSIS — Z86018 Personal history of other benign neoplasm: Secondary | ICD-10-CM | POA: Diagnosis not present

## 2021-06-17 DIAGNOSIS — L578 Other skin changes due to chronic exposure to nonionizing radiation: Secondary | ICD-10-CM | POA: Diagnosis not present

## 2021-08-09 DIAGNOSIS — F411 Generalized anxiety disorder: Secondary | ICD-10-CM | POA: Diagnosis not present

## 2021-08-09 DIAGNOSIS — F422 Mixed obsessional thoughts and acts: Secondary | ICD-10-CM | POA: Diagnosis not present

## 2021-08-09 DIAGNOSIS — F5105 Insomnia due to other mental disorder: Secondary | ICD-10-CM | POA: Diagnosis not present

## 2021-09-20 DIAGNOSIS — F5105 Insomnia due to other mental disorder: Secondary | ICD-10-CM | POA: Diagnosis not present

## 2021-09-20 DIAGNOSIS — Z79899 Other long term (current) drug therapy: Secondary | ICD-10-CM | POA: Diagnosis not present

## 2021-09-20 DIAGNOSIS — F411 Generalized anxiety disorder: Secondary | ICD-10-CM | POA: Diagnosis not present

## 2021-09-20 DIAGNOSIS — F422 Mixed obsessional thoughts and acts: Secondary | ICD-10-CM | POA: Diagnosis not present

## 2021-11-28 DIAGNOSIS — F5105 Insomnia due to other mental disorder: Secondary | ICD-10-CM | POA: Diagnosis not present

## 2021-11-28 DIAGNOSIS — F422 Mixed obsessional thoughts and acts: Secondary | ICD-10-CM | POA: Diagnosis not present

## 2021-11-28 DIAGNOSIS — F411 Generalized anxiety disorder: Secondary | ICD-10-CM | POA: Diagnosis not present

## 2021-12-05 ENCOUNTER — Ambulatory Visit
Admission: RE | Admit: 2021-12-05 | Discharge: 2021-12-05 | Disposition: A | Discharge status: Home / Self Care | Payer: BC Managed Care – PPO | Source: Ambulatory Visit | Attending: Obstetrics and Gynecology | Admitting: Obstetrics and Gynecology

## 2021-12-05 DIAGNOSIS — Z1231 Encounter for screening mammogram for malignant neoplasm of breast: Secondary | ICD-10-CM | POA: Diagnosis not present

## 2022-01-22 DIAGNOSIS — R070 Pain in throat: Secondary | ICD-10-CM | POA: Diagnosis not present

## 2022-01-22 DIAGNOSIS — B084 Enteroviral vesicular stomatitis with exanthem: Secondary | ICD-10-CM | POA: Diagnosis not present

## 2022-02-13 DIAGNOSIS — F411 Generalized anxiety disorder: Secondary | ICD-10-CM | POA: Diagnosis not present

## 2022-04-15 ENCOUNTER — Ambulatory Visit (INDEPENDENT_AMBULATORY_CARE_PROVIDER_SITE_OTHER): Payer: BC Managed Care – PPO | Admitting: Obstetrics and Gynecology

## 2022-04-15 ENCOUNTER — Encounter: Payer: Self-pay | Admitting: Obstetrics and Gynecology

## 2022-04-15 VITALS — BP 105/65 | HR 81 | Ht 67.0 in | Wt 197.0 lb

## 2022-04-15 DIAGNOSIS — F419 Anxiety disorder, unspecified: Secondary | ICD-10-CM

## 2022-04-15 DIAGNOSIS — Z6830 Body mass index (BMI) 30.0-30.9, adult: Secondary | ICD-10-CM

## 2022-04-15 DIAGNOSIS — E66811 Obesity, class 1: Secondary | ICD-10-CM

## 2022-04-15 DIAGNOSIS — Z01411 Encounter for gynecological examination (general) (routine) with abnormal findings: Secondary | ICD-10-CM | POA: Diagnosis not present

## 2022-04-15 DIAGNOSIS — Z23 Encounter for immunization: Secondary | ICD-10-CM | POA: Diagnosis not present

## 2022-04-15 DIAGNOSIS — F32A Depression, unspecified: Secondary | ICD-10-CM | POA: Diagnosis not present

## 2022-04-15 DIAGNOSIS — E669 Obesity, unspecified: Secondary | ICD-10-CM | POA: Diagnosis not present

## 2022-04-15 DIAGNOSIS — Z1231 Encounter for screening mammogram for malignant neoplasm of breast: Secondary | ICD-10-CM

## 2022-04-15 DIAGNOSIS — Z01419 Encounter for gynecological examination (general) (routine) without abnormal findings: Secondary | ICD-10-CM

## 2022-04-15 NOTE — Patient Instructions (Incomplete)
Breast Self-Awareness Breast self-awareness is knowing how your breasts look and feel. You need to: Check your breasts on a regular basis. Tell your doctor about any changes. Become familiar with the look and feel of your breasts. This can help you catch a breast problem while it is still small and can be treated. You should do breast self-exams even if you have breast implants. What you need: A mirror. A well-lit room. A pillow or other soft object. How to do a breast self-exam Follow these steps to do a breast self-exam: Look for changes  Take off all the clothes above your waist. Stand in front of a mirror in a room with good lighting. Put your hands down at your sides. Compare your breasts in the mirror. Look for any difference between them, such as: A difference in shape. A difference in size. Wrinkles, dips, and bumps in one breast and not the other. Look at each breast for changes in the skin, such as: Redness. Scaly areas. Skin that has gotten thicker. Dimpling. Open sores (ulcers). Look for changes in your nipples, such as: Fluid coming out of a nipple. Fluid around a nipple. Bleeding. Dimpling. Redness. A nipple that looks pushed in (retracted), or that has changed position. Feel for changes Lie on your back. Feel each breast. To do this: Pick a breast to feel. Place a pillow under the shoulder closest to that breast. Put the arm closest to that breast behind your head. Feel the nipple area of that breast using the hand of your other arm. Feel the area with the pads of your three middle fingers by making small circles with your fingers. Use light, medium, and firm pressure. Continue the overlapping circles, moving downward over the breast. Keep making circles with your fingers. Stop when you feel your ribs. Start making circles with your fingers again, this time going upward until you reach your collarbone. Then, make circles outward across your breast and into your  armpit area. Squeeze your nipple. Check for discharge and lumps. Repeat these steps to check your other breast. Sit or stand in the tub or shower. With soapy water on your skin, feel each breast the same way you did when you were lying down. Write down what you find Writing down what you find can help you remember what to tell your doctor. Write down: What is normal for each breast. Any changes you find in each breast. These include: The kind of changes you find. A tender or painful breast. Any lump you find. Write down its size and where it is. When you last had your monthly period (menstrual cycle). General tips If you are breastfeeding, the best time to check your breasts is after you feed your baby or after you use a breast pump. If you get monthly bleeding, the best time to check your breasts is 5-7 days after your monthly cycle ends. With time, you will become comfortable with the self-exam. You will also start to know if there are changes in your breasts. Contact a doctor if: You see a change in the shape or size of your breasts or nipples. You see a change in the skin of your breast or nipples, such as red or scaly skin. You have fluid coming from your nipples that is not normal. You find a new lump or thick area. You have breast pain. You have any concerns about your breast health. Summary Breast self-awareness includes looking for changes in your breasts and feeling for changes  within your breasts. You should do breast self-awareness in front of a mirror in a well-lit room. If you get monthly periods (menstrual cycles), the best time to check your breasts is 5-7 days after your period ends. Tell your doctor about any changes you see in your breasts. Changes include changes in size, changes on the skin, painful or tender breasts, or fluid from your nipples that is not normal. This information is not intended to replace advice given to you by your health care provider. Make sure  you discuss any questions you have with your health care provider. Document Revised: 05/16/2021 Document Reviewed: 05/16/2021 Elsevier Patient Education  2023 [employer] Preventive Care 21-39 Years Old, Female Preventive care refers to lifestyle choices and visits with your health care provider that can promote health and wellness. Preventive care visits are also called wellness exams. What can I expect for my preventive care visit? Counseling During your preventive care visit, your health care provider may ask about your: Medical history, including: Past medical problems. Family medical history. Pregnancy history. Current health, including: Menstrual cycle. Method of birth control. Emotional well-being. Home life and relationship well-being. Sexual activity and sexual health. Lifestyle, including: Alcohol, nicotine or tobacco, and drug use. Access to firearms. Diet, exercise, and sleep habits. Work and work environment. Sunscreen use. Safety issues such as seatbelt and bike helmet use. Physical exam Your health care provider may check your: Height and weight. These may be used to calculate your BMI (body mass index). BMI is a measurement that tells if you are at a healthy weight. Waist circumference. This measures the distance around your waistline. This measurement also tells if you are at a healthy weight and may help predict your risk of certain diseases, such as type 2 diabetes and high blood pressure. Heart rate and blood pressure. Body temperature. Skin for abnormal spots. What immunizations do I need?  Vaccines are usually given at various ages, according to a schedule. Your health care provider will recommend vaccines for you based on your age, medical history, and lifestyle or other factors, such as travel or where you work. What tests do I need? Screening Your health care provider may recommend screening tests for certain conditions. This may include: Pelvic exam  and Pap test. Lipid and cholesterol levels. Diabetes screening. This is done by checking your blood sugar (glucose) after you have not eaten for a while (fasting). Hepatitis B test. Hepatitis C test. HIV (human immunodeficiency virus) test. STI (sexually transmitted infection) testing, if you are at risk. BRCA-related cancer screening. This may be done if you have a family history of breast, ovarian, tubal, or peritoneal cancers. Talk with your health care provider about your test results, treatment options, and if necessary, the need for more tests. Follow these instructions at home: Eating and drinking  Eat a healthy diet that includes fresh fruits and vegetables, whole grains, lean protein, and low-fat dairy products. Take vitamin and mineral supplements as recommended by your health care provider. Do not drink alcohol if: Your health care provider tells you not to drink. You are pregnant, may be pregnant, or are planning to become pregnant. If you drink alcohol: Limit how much you have to 0-1 drink a day. Know how much alcohol is in your drink. In the [place] one drink equals one 12 oz bottle of beer (355 mL), one 5 oz glass of wine (148 mL), or one 1 oz glass of hard liquor (44 mL). Lifestyle Brush your teeth every morning and   night with fluoride toothpaste. Floss one time each day. Exercise for at least 30 minutes 5 or more days each week. Do not use any products that contain nicotine or tobacco. These products include cigarettes, chewing tobacco, and vaping devices, such as e-cigarettes. If you need help quitting, ask your health care provider. Do not use drugs. If you are sexually active, practice safe sex. Use a condom or other form of protection to prevent STIs. If you do not wish to become pregnant, use a form of birth control. If you plan to become pregnant, see your health care provider for a prepregnancy visit. Find healthy ways to manage stress, such as: Meditation, yoga, or  listening to music. Journaling. Talking to a trusted person. Spending time with friends and family. Minimize exposure to UV radiation to reduce your risk of skin cancer. Safety Always wear your seat belt while driving or riding in a vehicle. Do not drive: If you have been drinking alcohol. Do not ride with someone who has been drinking. If you have been using any mind-altering substances or drugs. While texting. When you are tired or distracted. Wear a helmet and other protective equipment during sports activities. If you have firearms in your house, make sure you follow all gun safety procedures. Seek help if you have been physically or sexually abused. What's next? Go to your health care provider once a year for an annual wellness visit. Ask your health care provider how often you should have your eyes and teeth checked. Stay up to date on all vaccines. This information is not intended to replace advice given to you by your health care provider. Make sure you discuss any questions you have with your health care provider. Document Revised: 01/09/2021 Document Reviewed: 01/09/2021 Elsevier Patient Education  [place].

## 2022-04-15 NOTE — Progress Notes (Signed)
GYNECOLOGY ANNUAL PHYSICAL EXAM PROGRESS NOTE  Subjective:    Melanie Stevenson is a 41 y.o. G68P1001 female who presents for an annual exam. The patient has no complaints today. The patient is sexually active. The patient participates in regular exercise: yes. Has the patient ever been transfused or tattooed?: yes. The patient reports that there is not domestic violence in her life.    Menstrual History: Menarche age: 16 Patient's last menstrual period was 03/22/2022. Period Cycle (Days): 26 Period Duration (Days): 4 Period Pattern: Regular Menstrual Flow: Light, Moderate Menstrual Control Change Freq (Hours): 3-4 Dysmenorrhea: None   Gynecologic History:  Contraception: condoms History of STI's: Denies Last Pap: 03/27/2020. Results were: normal.  Reports remote h/o abnormal pap smears, has remote history of LEEP procedure. Last mammogram: 12/05/2021.  Results were: normal.      Upstream - 04/15/22 1118       Pregnancy Intention Screening   Does the patient want to become pregnant in the next year? Ok Either Way    Does the patient's partner want to become pregnant in the next year? Ok Either Way    Would the patient like to discuss contraceptive options today? No      Contraception Wrap Up   Current Method No Method - Other Reason    End Method No Method - Other Reason    Contraception Counseling Provided No    How was the end contraceptive method provided? N/A            The pregnancy intention screening data noted above was reviewed. Potential methods of contraception were discussed. The patient elected to proceed with No Method - Other Reason (History of infertility).  OB History  Gravida Para Term Preterm AB Living  1 1 1  0 0 1  SAB IAB Ectopic Multiple Live Births  0 0 0 0 1    # Outcome Date GA Lbr Len/2nd Weight Sex Delivery Anes PTL Lv  1 Term 11/15/19 [redacted]w[redacted]d 02:49 / 02:02 9 lb 1 oz (4.11 kg) M Vag-Spont EPI  LIV     Name: Apps,BOY Lailany     Apgar1:  8  Apgar5: 9    Past Medical History:  Diagnosis Date   Abnormal Pap smear of cervix    Anxiety    Depression    HPV (human papilloma virus) infection    HSV infection     Past Surgical History:  Procedure Laterality Date   CERVICAL BIOPSY  W/ LOOP ELECTRODE EXCISION     KNEE SURGERY Right     Family History  Problem Relation Age of Onset   Healthy Mother    Healthy Father    Breast cancer Neg Hx    Ovarian cancer Neg Hx    Cervical cancer Neg Hx     Social History   Socioeconomic History   Marital status: Married    Spouse name: Not on file   Number of children: Not on file   Years of education: Not on file   Highest education level: Not on file  Occupational History   Not on file  Tobacco Use   Smoking status: Never   Smokeless tobacco: Never  Vaping Use   Vaping Use: Never used  Substance and Sexual Activity   Alcohol use: Yes    Comment: Few beers a week    Drug use: No   Sexual activity: Yes    Birth control/protection: Condom, Other-see comments    Comment: undecided  Other Topics  Concern   Not on file  Social History Narrative   Not on file   Social Determinants of Health   Financial Resource Strain: Not on file  Food Insecurity: Not on file  Transportation Needs: Not on file  Physical Activity: Not on file  Stress: Not on file  Social Connections: Not on file  Intimate Partner Violence: Not on file    Current Outpatient Medications on File Prior to Visit  Medication Sig Dispense Refill   FLUoxetine (PROZAC) 10 MG capsule Take 10 mg by mouth daily.     FLUoxetine (PROZAC) 20 MG capsule Take by mouth.     loratadine (CLARITIN) 10 MG tablet Take 10 mg by mouth daily.     Prenatal Vit-Fe Fumarate-FA (MULTIVITAMIN-PRENATAL) 27-0.8 MG TABS tablet Take 1 tablet by mouth daily at 12 noon.     Probiotic Product (PROBIOTIC COLON SUPPORT) CAPS Take by mouth.     No current facility-administered medications on file prior to visit.    Allergies   Allergen Reactions   Codeine     Other reaction(s): Vomiting     Review of Systems Constitutional: negative for chills, fatigue, fevers and sweats Eyes: negative for irritation, redness and visual disturbance Ears, nose, mouth, throat, and face: negative for hearing loss, nasal congestion, snoring and tinnitus Respiratory: negative for asthma, cough, sputum Cardiovascular: negative for chest pain, dyspnea, exertional chest pressure/discomfort, irregular heart beat, palpitations and syncope Gastrointestinal: negative for abdominal pain, change in bowel habits, nausea and vomiting Genitourinary: negative for abnormal menstrual periods, genital lesions, sexual problems and vaginal discharge, dysuria and urinary incontinence Integument/breast: negative for breast lump, breast tenderness and nipple discharge Hematologic/lymphatic: negative for bleeding and easy bruising Musculoskeletal:negative for back pain and muscle weakness Neurological: negative for dizziness, headaches, vertigo and weakness Endocrine: negative for diabetic symptoms including polydipsia, polyuria and skin dryness Allergic/Immunologic: negative for hay fever and urticaria      Objective:  Blood pressure 105/65, pulse 81, height 5\' 7"  (1.702 m), weight 197 lb (89.4 kg), last menstrual period 03/22/2022.  Body mass index is 30.85 kg/m.    General Appearance:    Alert, cooperative, no distress, appears stated age, mild obesity  Head:    Normocephalic, without obvious abnormality, atraumatic  Eyes:    PERRL, conjunctiva/corneas clear, EOM's intact, both eyes  Ears:    Normal external ear canals, both ears  Nose:   Nares normal, septum midline, mucosa normal, no drainage or sinus tenderness  Throat:   Lips, mucosa, and tongue normal; teeth and gums normal  Neck:   Supple, symmetrical, trachea midline, no adenopathy; thyroid: no enlargement/tenderness/nodules; no carotid bruit or JVD  Back:     Symmetric, no curvature,  ROM normal, no CVA tenderness  Lungs:     Clear to auscultation bilaterally, respirations unlabored  Chest Wall:    No tenderness or deformity   Heart:    Regular rate and rhythm, S1 and S2 normal, no murmur, rub or gallop  Breast Exam:    No tenderness, masses, or nipple abnormality  Abdomen:     Soft, non-tender, bowel sounds active all four quadrants, no masses, no organomegaly.    Genitalia:    Pelvic:external genitalia normal, vagina without lesions, discharge, or tenderness, rectovaginal septum  normal. Cervix normal in appearance, no cervical motion tenderness, no adnexal masses or tenderness.  Uterus normal size, shape, mobile, regular contours, nontender.  Rectal:    Normal external sphincter.  No hemorrhoids appreciated. Internal exam not done.   Extremities:  Extremities normal, atraumatic, no cyanosis or edema  Pulses:   2+ and symmetric all extremities  Skin:   Skin color, texture, turgor normal, no rashes or lesions  Lymph nodes:   Cervical, supraclavicular, and axillary nodes normal  Neurologic:   CNII-XII intact, normal strength, sensation and reflexes throughout      04/15/2022   11:46 AM 04/12/2021    2:14 PM 12/20/2019    1:43 PM 12/01/2019   11:47 AM  GAD 7 : Generalized Anxiety Score  Nervous, Anxious, on Edge 1 1 2 3   Control/stop worrying 1 1 2 3   Worry too much - different things 0 0 2 3  Trouble relaxing 0 0 0 2  Restless 0 0 0 0  Easily annoyed or irritable 0 0 0 0  Afraid - awful might happen 1 0 3 2  Total GAD 7 Score 3 2 9 13   Anxiety Difficulty Not difficult at all Not difficult at all Not difficult at all Not difficult at all        04/15/2022   11:46 AM 04/12/2021    2:14 PM  Depression screen PHQ 2/9  Decreased Interest 0 0  Down, Depressed, Hopeless 0 1  PHQ - 2 Score 0 1  Altered sleeping 0 1  Tired, decreased energy 0 0  Change in appetite 0 0  Feeling bad or failure about yourself  0 0  Trouble concentrating 0 0  Moving slowly or  fidgety/restless 0 0  Suicidal thoughts 0 0  PHQ-9 Score 0 2  Difficult doing work/chores Not difficult at all Not difficult at all     Labs:  Lab Results  Component Value Date   WBC 7.6 04/12/2021   HGB 13.9 04/12/2021   HCT 42.1 04/12/2021   MCV 92 04/12/2021   PLT 304 04/12/2021    Lab Results  Component Value Date   CREATININE 0.72 04/12/2021   BUN 12 04/12/2021   NA 138 04/12/2021   K 3.9 04/12/2021   CL 102 04/12/2021   CO2 23 04/12/2021    Lab Results  Component Value Date   ALT 19 04/12/2021   AST 19 04/12/2021   ALKPHOS 87 04/12/2021   BILITOT 0.3 04/12/2021    Lab Results  Component Value Date   TSH 2.130 03/27/2020     Assessment:   1. Encounter for screening mammogram for malignant neoplasm of breast   2. Encounter for well woman exam with routine gynecological exam   3. Anxiety and depression   4. Obesity (BMI 30.0-34.9)   5. Need for immunization against influenza      Plan:  Blood tests: CBC with diff, Comprehensive metabolic panel, Lipoproteins, TSH, and HgbA1c. Breast self exam technique reviewed and patient encouraged to perform self-exam monthly. Contraception: none, but will use condoms. Discussed healthy lifestyle modifications. Mammogram  up to date. New order placed for next year.  Pap smear  UTD . COVID vaccination status: declines Anxiety and depression, currently stable on Prozac. Flu vaccine given today.  Follow up in 1 year for annual exam   04/14/2021, MD [facility]

## 2022-04-16 LAB — CBC
Hematocrit: 45 % (ref 34.0–46.6)
Hemoglobin: 15.1 g/dL (ref 11.1–15.9)
MCH: 30.6 pg (ref 26.6–33.0)
MCHC: 33.6 g/dL (ref 31.5–35.7)
MCV: 91 fL (ref 79–97)
Platelets: 309 10*3/uL (ref 150–450)
RBC: 4.93 x10E6/uL (ref 3.77–5.28)
RDW: 12.3 % (ref 11.7–15.4)
WBC: 6.5 10*3/uL (ref 3.4–10.8)

## 2022-04-16 LAB — COMPREHENSIVE METABOLIC PANEL
ALT: 16 IU/L (ref 0–32)
AST: 19 IU/L (ref 0–40)
Albumin/Globulin Ratio: 2 (ref 1.2–2.2)
Albumin: 4.4 g/dL (ref 3.9–4.9)
Alkaline Phosphatase: 67 IU/L (ref 44–121)
BUN/Creatinine Ratio: 18 (ref 9–23)
BUN: 15 mg/dL (ref 6–24)
Bilirubin Total: 0.4 mg/dL (ref 0.0–1.2)
CO2: 21 mmol/L (ref 20–29)
Calcium: 9.4 mg/dL (ref 8.7–10.2)
Chloride: 103 mmol/L (ref 96–106)
Creatinine, Ser: 0.85 mg/dL (ref 0.57–1.00)
Globulin, Total: 2.2 g/dL (ref 1.5–4.5)
Glucose: 79 mg/dL (ref 70–99)
Potassium: 4.5 mmol/L (ref 3.5–5.2)
Sodium: 139 mmol/L (ref 134–144)
Total Protein: 6.6 g/dL (ref 6.0–8.5)
eGFR: 89 mL/min/{1.73_m2} (ref >59)

## 2022-04-16 LAB — LIPID PANEL
Chol/HDL Ratio: 3.7 ratio (ref 0.0–4.4)
Cholesterol, Total: 186 mg/dL (ref 100–199)
HDL: 50 mg/dL (ref >39)
LDL Chol Calc (NIH): 116 mg/dL — ABNORMAL HIGH (ref 0–99)
Triglycerides: 111 mg/dL (ref 0–149)
VLDL Cholesterol Cal: 20 mg/dL (ref 5–40)

## 2022-04-16 LAB — TSH: TSH: 1.95 u[IU]/mL (ref 0.450–4.500)

## 2022-04-16 LAB — HEMOGLOBIN A1C
Est. average glucose Bld gHb Est-mCnc: 117 mg/dL
Hgb A1c MFr Bld: 5.7 % — ABNORMAL HIGH (ref 4.8–5.6)

## 2022-06-03 DIAGNOSIS — F5105 Insomnia due to other mental disorder: Secondary | ICD-10-CM | POA: Diagnosis not present

## 2022-06-03 DIAGNOSIS — F411 Generalized anxiety disorder: Secondary | ICD-10-CM | POA: Diagnosis not present

## 2022-06-03 DIAGNOSIS — F422 Mixed obsessional thoughts and acts: Secondary | ICD-10-CM | POA: Diagnosis not present

## 2022-06-17 ENCOUNTER — Other Ambulatory Visit: Payer: Self-pay | Admitting: Family Medicine

## 2022-06-17 ENCOUNTER — Ambulatory Visit
Admission: RE | Admit: 2022-06-17 | Discharge: 2022-06-17 | Disposition: A | Discharge status: Home / Self Care | Payer: BC Managed Care – PPO | Source: Ambulatory Visit | Attending: Family Medicine | Admitting: Family Medicine

## 2022-06-17 ENCOUNTER — Ambulatory Visit
Admission: RE | Admit: 2022-06-17 | Discharge: 2022-06-17 | Disposition: A | Discharge status: Home / Self Care | Payer: BC Managed Care – PPO | Attending: Family Medicine | Admitting: Family Medicine

## 2022-06-17 DIAGNOSIS — R079 Chest pain, unspecified: Secondary | ICD-10-CM | POA: Diagnosis not present

## 2022-06-17 DIAGNOSIS — R059 Cough, unspecified: Secondary | ICD-10-CM | POA: Diagnosis not present

## 2022-06-17 DIAGNOSIS — L578 Other skin changes due to chronic exposure to nonionizing radiation: Secondary | ICD-10-CM | POA: Diagnosis not present

## 2022-06-17 DIAGNOSIS — J22 Unspecified acute lower respiratory infection: Secondary | ICD-10-CM | POA: Diagnosis not present

## 2022-06-17 DIAGNOSIS — Z86018 Personal history of other benign neoplasm: Secondary | ICD-10-CM | POA: Diagnosis not present

## 2022-09-09 DIAGNOSIS — F5105 Insomnia due to other mental disorder: Secondary | ICD-10-CM | POA: Diagnosis not present

## 2022-09-09 DIAGNOSIS — F422 Mixed obsessional thoughts and acts: Secondary | ICD-10-CM | POA: Diagnosis not present

## 2022-09-09 DIAGNOSIS — F411 Generalized anxiety disorder: Secondary | ICD-10-CM | POA: Diagnosis not present

## 2022-09-25 ENCOUNTER — Other Ambulatory Visit: Payer: Self-pay | Admitting: Obstetrics and Gynecology

## 2022-09-25 DIAGNOSIS — Z1231 Encounter for screening mammogram for malignant neoplasm of breast: Secondary | ICD-10-CM

## 2022-12-02 DIAGNOSIS — F411 Generalized anxiety disorder: Secondary | ICD-10-CM | POA: Diagnosis not present

## 2022-12-02 DIAGNOSIS — F5105 Insomnia due to other mental disorder: Secondary | ICD-10-CM | POA: Diagnosis not present

## 2022-12-02 DIAGNOSIS — Z79899 Other long term (current) drug therapy: Secondary | ICD-10-CM | POA: Diagnosis not present

## 2022-12-02 DIAGNOSIS — F422 Mixed obsessional thoughts and acts: Secondary | ICD-10-CM | POA: Diagnosis not present

## 2022-12-23 ENCOUNTER — Ambulatory Visit
Admission: RE | Admit: 2022-12-23 | Discharge: 2022-12-23 | Disposition: A | Discharge status: Home / Self Care | Payer: BC Managed Care – PPO | Source: Ambulatory Visit | Attending: Obstetrics and Gynecology | Admitting: Obstetrics and Gynecology

## 2022-12-23 DIAGNOSIS — Z1231 Encounter for screening mammogram for malignant neoplasm of breast: Secondary | ICD-10-CM | POA: Insufficient documentation

## 2023-01-09 DIAGNOSIS — M7751 Other enthesopathy of right foot: Secondary | ICD-10-CM | POA: Diagnosis not present

## 2023-01-19 DIAGNOSIS — M7751 Other enthesopathy of right foot: Secondary | ICD-10-CM | POA: Diagnosis not present

## 2023-03-10 DIAGNOSIS — F5105 Insomnia due to other mental disorder: Secondary | ICD-10-CM | POA: Diagnosis not present

## 2023-03-10 DIAGNOSIS — F422 Mixed obsessional thoughts and acts: Secondary | ICD-10-CM | POA: Diagnosis not present

## 2023-03-10 DIAGNOSIS — F411 Generalized anxiety disorder: Secondary | ICD-10-CM | POA: Diagnosis not present

## 2023-03-21 IMAGING — MG MM DIGITAL SCREENING BILAT W/ TOMO AND CAD
8 series · 8 of 24 positions shown · non-contrast
Comparison: None available.

CLINICAL DATA: Screening.

EXAM:
DIGITAL SCREENING BILATERAL MAMMOGRAM WITH TOMOSYNTHESIS AND CAD
TECHNIQUE: Bilateral screening digital craniocaudal and mediolateral oblique
mammograms were obtained. Bilateral screening digital breast
tomosynthesis was performed. The images were evaluated with
computer-aided detection.

[L MLO synth-2D]
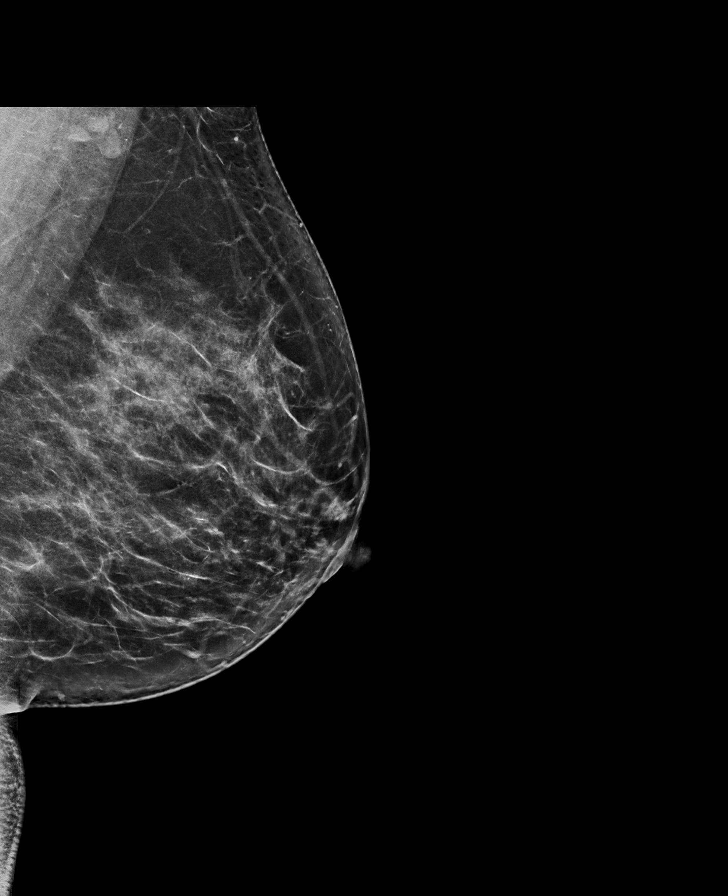

[R MLO synth-2D]
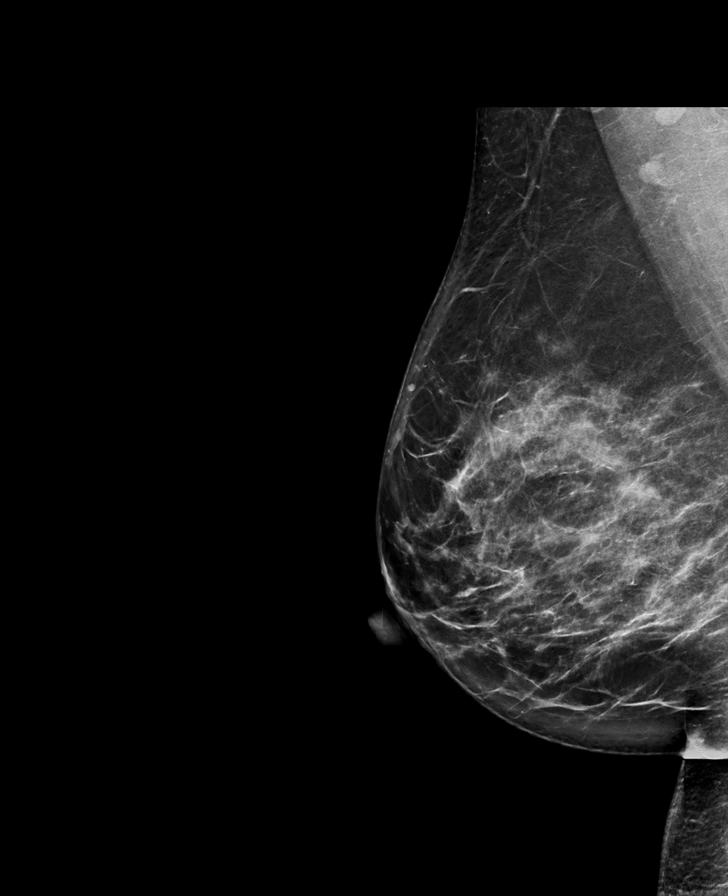

[L CC synth-2D]
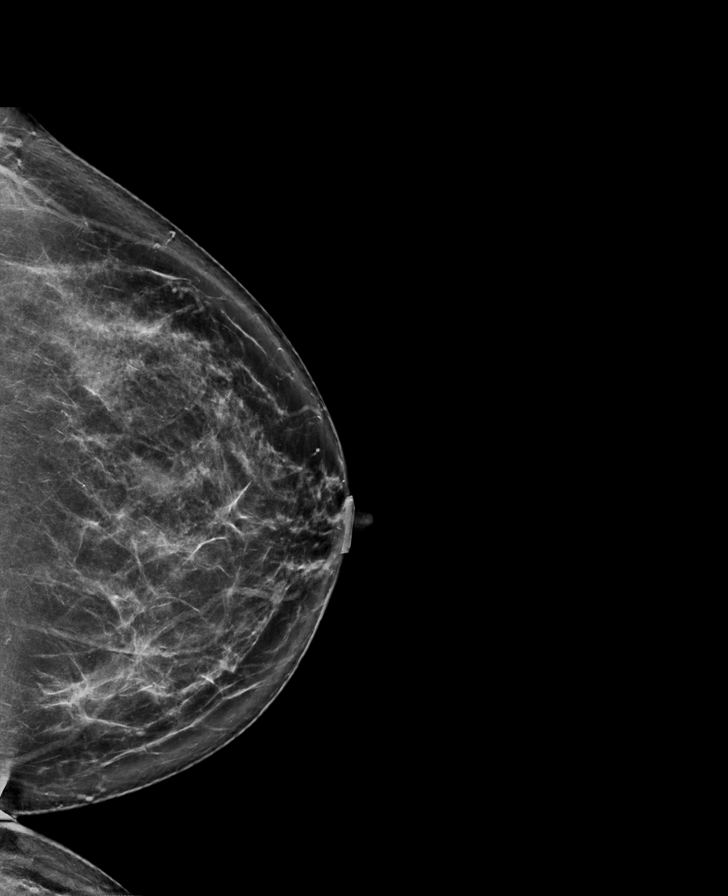

[R CC synth-2D]
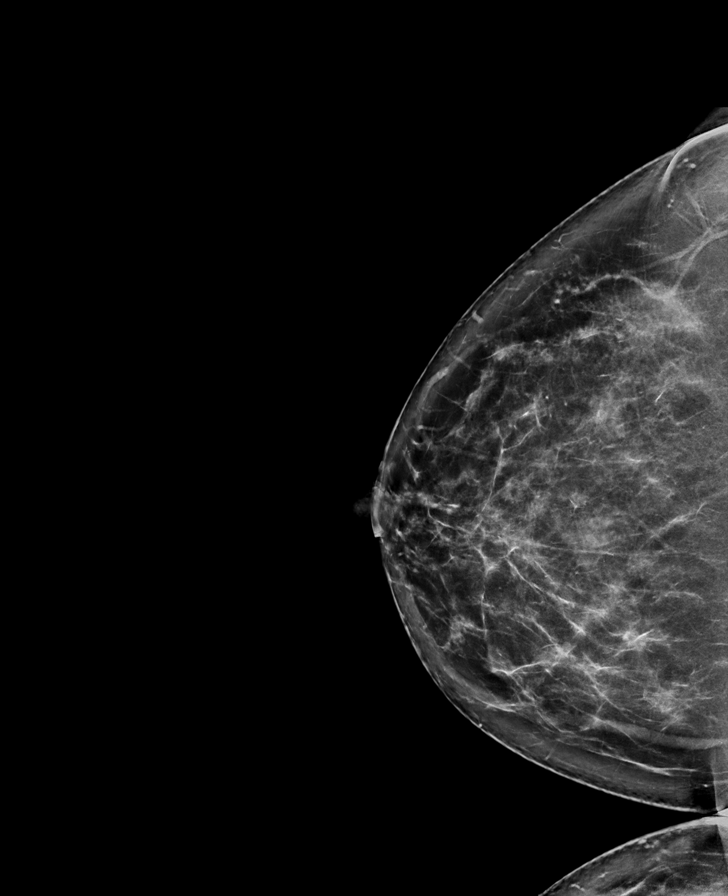

[L MLO tomo · tomo slice 43/84.0]
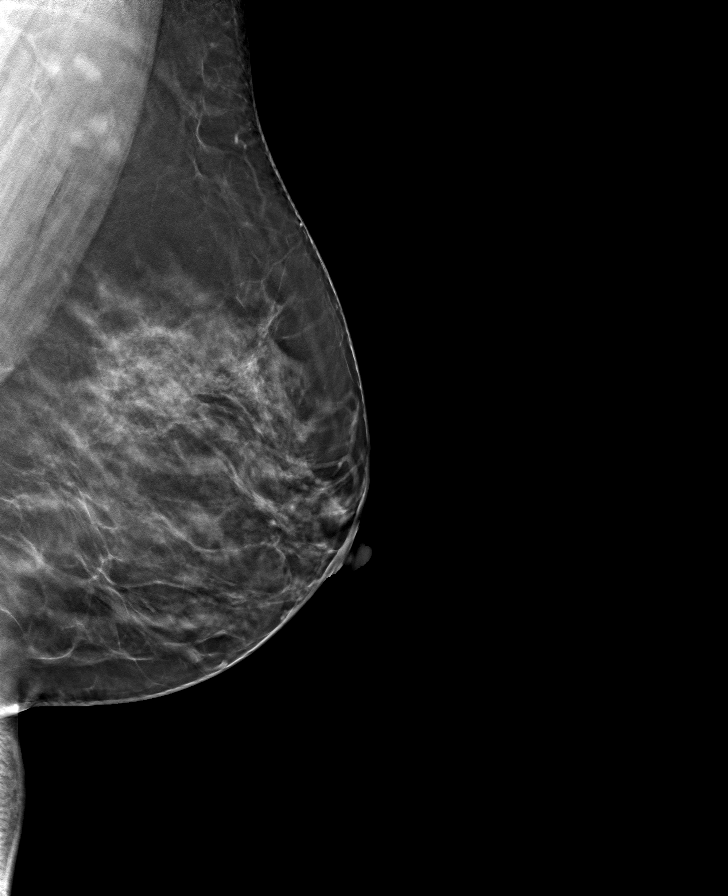

[R MLO tomo · tomo slice 45/88.0]
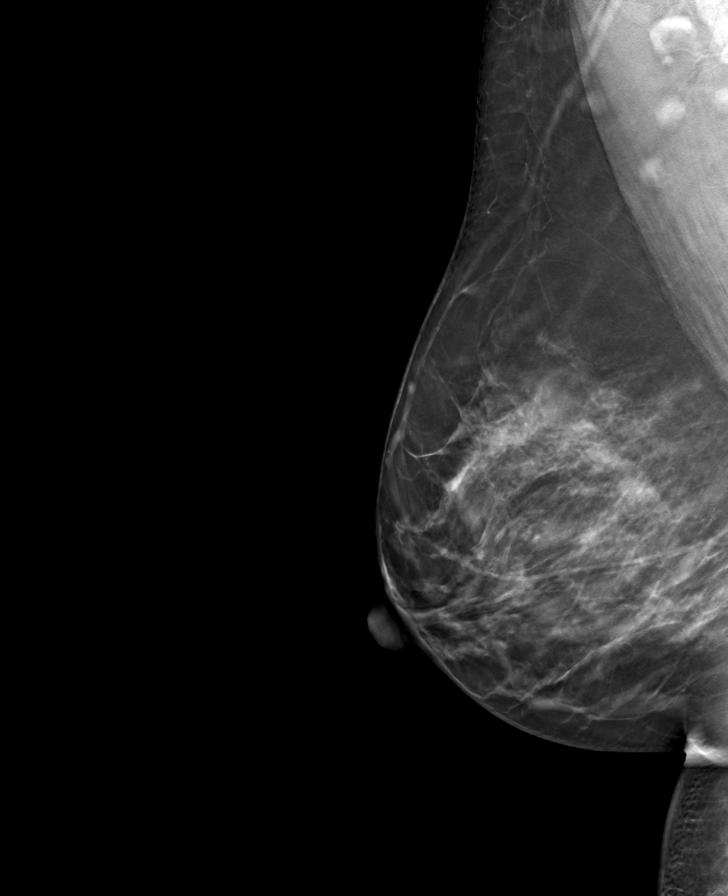

[R CC tomo · tomo slice 44/87.0]
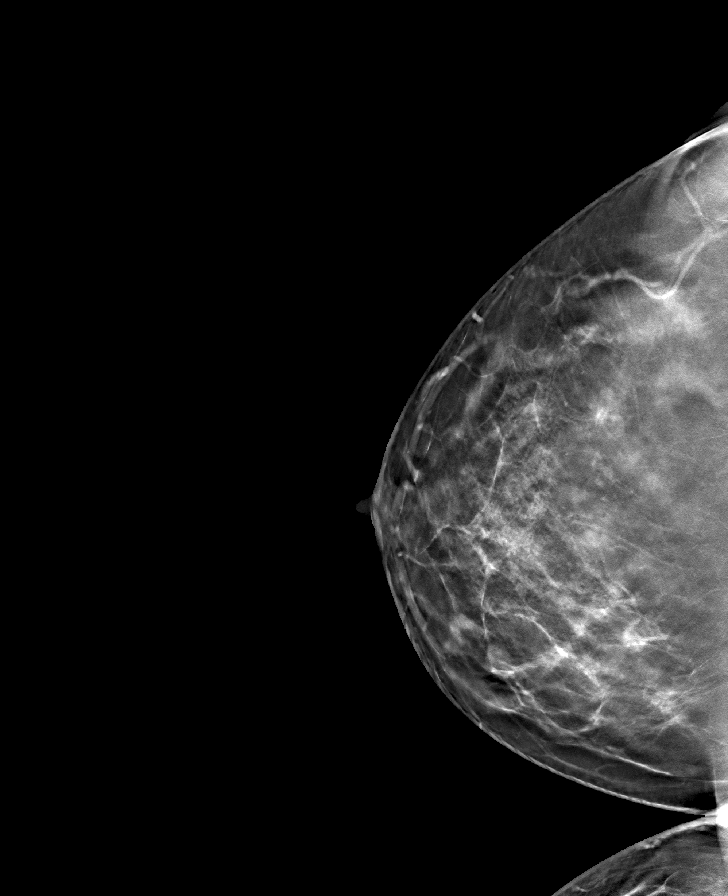

[L CC tomo · tomo slice 43/86.0]
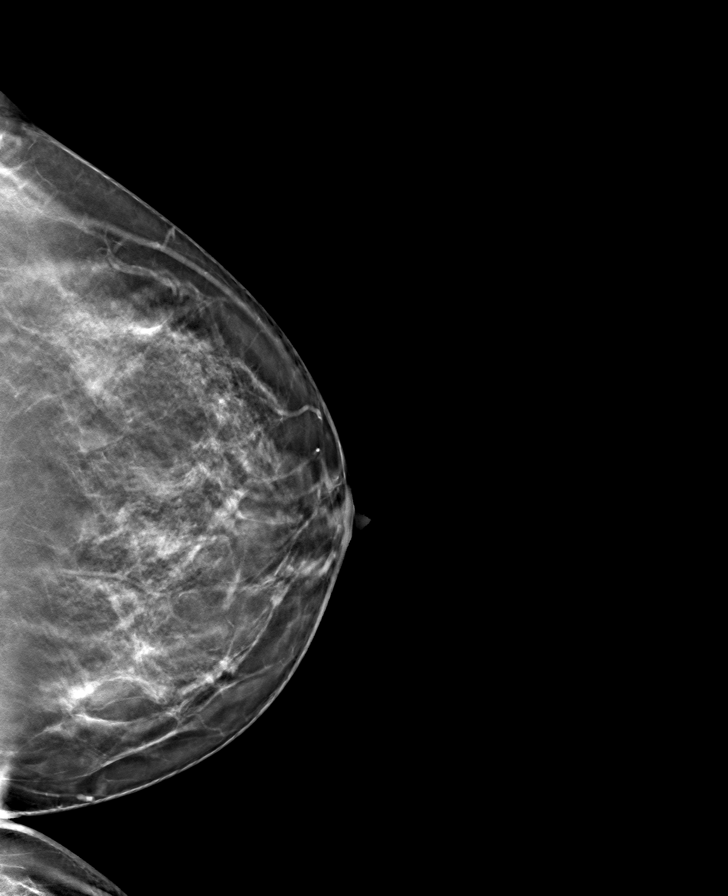

[8 of 24 positions shown; findings below may reference images not displayed]

ACR Breast Density Category c: The breast tissue is heterogeneously
dense, which may obscure small masses
FINDINGS: There are no findings suspicious for malignancy.
IMPRESSION: No mammographic evidence of malignancy. A result letter of this
screening mammogram will be mailed directly to the patient.

RECOMMENDATION:
Screening mammogram in one year. (Code:A7-O-PCM)

BI-RADS CATEGORY  1: Negative.

## 2023-06-02 DIAGNOSIS — F422 Mixed obsessional thoughts and acts: Secondary | ICD-10-CM | POA: Diagnosis not present

## 2023-06-02 DIAGNOSIS — F5105 Insomnia due to other mental disorder: Secondary | ICD-10-CM | POA: Diagnosis not present

## 2023-06-02 DIAGNOSIS — F411 Generalized anxiety disorder: Secondary | ICD-10-CM | POA: Diagnosis not present

## 2023-06-23 DIAGNOSIS — L570 Actinic keratosis: Secondary | ICD-10-CM | POA: Diagnosis not present

## 2023-06-23 DIAGNOSIS — Z86018 Personal history of other benign neoplasm: Secondary | ICD-10-CM | POA: Diagnosis not present

## 2023-06-23 DIAGNOSIS — L578 Other skin changes due to chronic exposure to nonionizing radiation: Secondary | ICD-10-CM | POA: Diagnosis not present

## 2023-07-01 NOTE — Progress Notes (Unsigned)
GYNECOLOGY ANNUAL PHYSICAL EXAM PROGRESS NOTE  Subjective:    Melanie Stevenson is a 42 y.o. G21P1001 female who presents for an annual exam.  The patient {is/is not/has never been:13135} sexually active. The patient participates in regular exercise: {yes/no/not asked:9010}. Has the patient ever been transfused or tattooed?: {yes/no/not asked:9010}. The patient reports that there {is/is not:9024} domestic violence in her life.   The patient has the following complaints today:   Menstrual History: Menarche age: 26 No LMP recorded.     Gynecologic History:  Contraception: condoms History of STI's: Denies Last Pap: 03/27/2020. Results were: normal. Reports remote h/o abnormal pap smears, has remote history of LEEP procedure.  Last mammogram: 12/23/2022. Results were: normal      OB History  Gravida Para Term Preterm AB Living  1 1 1  0 0 1  SAB IAB Ectopic Multiple Live Births  0 0 0 0 1    # Outcome Date GA Lbr Len/2nd Weight Sex Type Anes PTL Lv  1 Term 11/15/19 [redacted]w[redacted]d 02:49 / 02:02 9 lb 1 oz (4.11 kg) M Vag-Spont EPI  LIV     Name: Scalia,BOY Kiaria     Apgar1: 8  Apgar5: 9    Past Medical History:  Diagnosis Date   Abnormal Pap smear of cervix    Anxiety    Depression    HPV (human papilloma virus) infection    HSV infection     Past Surgical History:  Procedure Laterality Date   CERVICAL BIOPSY  W/ LOOP ELECTRODE EXCISION     KNEE SURGERY Right     Family History  Problem Relation Age of Onset   Healthy Mother    Healthy Father    Breast cancer Neg Hx    Ovarian cancer Neg Hx    Cervical cancer Neg Hx     Social History   Socioeconomic History   Marital status: Married    Spouse name: Not on file   Number of children: Not on file   Years of education: Not on file   Highest education level: Not on file  Occupational History   Not on file  Tobacco Use   Smoking status: Never   Smokeless tobacco: Never  Vaping Use   Vaping status: Never Used   Substance and Sexual Activity   Alcohol use: Yes    Comment: Few beers a week    Drug use: No   Sexual activity: Yes    Birth control/protection: Condom, Other-see comments    Comment: undecided  Other Topics Concern   Not on file  Social History Narrative   Not on file   Social Determinants of Health   Financial Resource Strain: Not on file  Food Insecurity: Not on file  Transportation Needs: Not on file  Physical Activity: Not on file  Stress: Not on file  Social Connections: Not on file  Intimate Partner Violence: Not on file    Current Outpatient Medications on File Prior to Visit  Medication Sig Dispense Refill   FLUoxetine (PROZAC) 40 MG capsule Take 40 mg by mouth at bedtime.     loratadine (CLARITIN) 10 MG tablet Take 10 mg by mouth daily.     Prenatal Vit-Fe Fumarate-FA (MULTIVITAMIN-PRENATAL) 27-0.8 MG TABS tablet Take 1 tablet by mouth daily at 12 noon.     Probiotic Product (PROBIOTIC COLON SUPPORT) CAPS Take by mouth.     No current facility-administered medications on file prior to visit.    Allergies  Allergen  Reactions   Codeine     Other reaction(s): Vomiting     Review of Systems Constitutional: negative for chills, fatigue, fevers and sweats Eyes: negative for irritation, redness and visual disturbance Ears, nose, mouth, throat, and face: negative for hearing loss, nasal congestion, snoring and tinnitus Respiratory: negative for asthma, cough, sputum Cardiovascular: negative for chest pain, dyspnea, exertional chest pressure/discomfort, irregular heart beat, palpitations and syncope Gastrointestinal: negative for abdominal pain, change in bowel habits, nausea and vomiting Genitourinary: negative for abnormal menstrual periods, genital lesions, sexual problems and vaginal discharge, dysuria and urinary incontinence Integument/breast: negative for breast lump, breast tenderness and nipple discharge Hematologic/lymphatic: negative for bleeding and  easy bruising Musculoskeletal:negative for back pain and muscle weakness Neurological: negative for dizziness, headaches, vertigo and weakness Endocrine: negative for diabetic symptoms including polydipsia, polyuria and skin dryness Allergic/Immunologic: negative for hay fever and urticaria      Objective:  There were no vitals taken for this visit. There is no height or weight on file to calculate BMI.    General Appearance:    Alert, cooperative, no distress, appears stated age  Head:    Normocephalic, without obvious abnormality, atraumatic  Eyes:    PERRL, conjunctiva/corneas clear, EOM's intact, both eyes  Ears:    Normal external ear canals, both ears  Nose:   Nares normal, septum midline, mucosa normal, no drainage or sinus tenderness  Throat:   Lips, mucosa, and tongue normal; teeth and gums normal  Neck:   Supple, symmetrical, trachea midline, no adenopathy; thyroid: no enlargement/tenderness/nodules; no carotid bruit or JVD  Back:     Symmetric, no curvature, ROM normal, no CVA tenderness  Lungs:     Clear to auscultation bilaterally, respirations unlabored  Chest Wall:    No tenderness or deformity   Heart:    Regular rate and rhythm, S1 and S2 normal, no murmur, rub or gallop  Breast Exam:    No tenderness, masses, or nipple abnormality  Abdomen:     Soft, non-tender, bowel sounds active all four quadrants, no masses, no organomegaly.    Genitalia:    Pelvic:external genitalia normal, vagina without lesions, discharge, or tenderness, rectovaginal septum  normal. Cervix normal in appearance, no cervical motion tenderness, no adnexal masses or tenderness.  Uterus normal size, shape, mobile, regular contours, nontender.  Rectal:    Normal external sphincter.  No hemorrhoids appreciated. Internal exam not done.   Extremities:   Extremities normal, atraumatic, no cyanosis or edema  Pulses:   2+ and symmetric all extremities  Skin:   Skin color, texture, turgor normal, no rashes or  lesions  Lymph nodes:   Cervical, supraclavicular, and axillary nodes normal  Neurologic:   CNII-XII intact, normal strength, sensation and reflexes throughout   .  Labs:  Lab Results  Component Value Date   WBC 6.5 04/15/2022   HGB 15.1 04/15/2022   HCT 45.0 04/15/2022   MCV 91 04/15/2022   PLT 309 04/15/2022    Lab Results  Component Value Date   CREATININE 0.85 04/15/2022   BUN 15 04/15/2022   NA 139 04/15/2022   K 4.5 04/15/2022   CL 103 04/15/2022   CO2 21 04/15/2022    Lab Results  Component Value Date   ALT 16 04/15/2022   AST 19 04/15/2022   ALKPHOS 67 04/15/2022   BILITOT 0.4 04/15/2022    Lab Results  Component Value Date   TSH 1.950 04/15/2022     Assessment:   No diagnosis found.  Plan:  Blood tests: {blood tests:13147}. Breast self exam technique reviewed and patient encouraged to perform self-exam monthly. Contraception: condoms. Discussed healthy lifestyle modifications. Mammogram ordered for next visit Pap smear ordered. Flu vaccine: Follow up in 1 year for annual exam    Hildred Laser, MD [facility]

## 2023-07-01 NOTE — Patient Instructions (Incomplete)
Preventive Care 40-41 Years Old, Female Preventive care refers to lifestyle choices and visits with your health care provider that can promote health and wellness. Preventive care visits are also called wellness exams. What can I expect for my preventive care visit? Counseling Your health care provider may ask you questions about your: Medical history, including: Past medical problems. Family medical history. Pregnancy history. Current health, including: Menstrual cycle. Method of birth control. Emotional well-being. Home life and relationship well-being. Sexual activity and sexual health. Lifestyle, including: Alcohol, nicotine or tobacco, and drug use. Access to firearms. Diet, exercise, and sleep habits. Work and work environment. Sunscreen use. Safety issues such as seatbelt and bike helmet use. Physical exam Your health care provider will check your: Height and weight. These may be used to calculate your BMI (body mass index). BMI is a measurement that tells if you are at a healthy weight. Waist circumference. This measures the distance around your waistline. This measurement also tells if you are at a healthy weight and may help predict your risk of certain diseases, such as type 2 diabetes and high blood pressure. Heart rate and blood pressure. Body temperature. Skin for abnormal spots. What immunizations do I need?  Vaccines are usually given at various ages, according to a schedule. Your health care provider will recommend vaccines for you based on your age, medical history, and lifestyle or other factors, such as travel or where you work. What tests do I need? Screening Your health care provider may recommend screening tests for certain conditions. This may include: Lipid and cholesterol levels. Diabetes screening. This is done by checking your blood sugar (glucose) after you have not eaten for a while (fasting). Pelvic exam and Pap test. Hepatitis B test. Hepatitis C  test. HIV (human immunodeficiency virus) test. STI (sexually transmitted infection) testing, if you are at risk. Lung cancer screening. Colorectal cancer screening. Mammogram. Talk with your health care provider about when you should start having regular mammograms. This may depend on whether you have a family history of breast cancer. BRCA-related cancer screening. This may be done if you have a family history of breast, ovarian, tubal, or peritoneal cancers. Bone density scan. This is done to screen for osteoporosis. Talk with your health care provider about your test results, treatment options, and if necessary, the need for more tests. Follow these instructions at home: Eating and drinking  Eat a diet that includes fresh fruits and vegetables, whole grains, lean protein, and low-fat dairy products. Take vitamin and mineral supplements as recommended by your health care provider. Do not drink alcohol if: Your health care provider tells you not to drink. You are pregnant, may be pregnant, or are planning to become pregnant. If you drink alcohol: Limit how much you have to 0-1 drink a day. Know how much alcohol is in your drink. In the [place] one drink equals one 12 oz bottle of beer (355 mL), one 5 oz glass of wine (148 mL), or one 1 oz glass of hard liquor (44 mL). Lifestyle Brush your teeth every morning and night with fluoride toothpaste. Floss one time each day. Exercise for at least 30 minutes 5 or more days each week. Do not use any products that contain nicotine or tobacco. These products include cigarettes, chewing tobacco, and vaping devices, such as e-cigarettes. If you need help quitting, ask your health care provider. Do not use drugs. If you are sexually active, practice safe sex. Use a condom or other form of protection to   prevent STIs. If you do not wish to become pregnant, use a form of birth control. If you plan to become pregnant, see your health care provider for a  prepregnancy visit. Take aspirin only as told by your health care provider. Make sure that you understand how much to take and what form to take. Work with your health care provider to find out whether it is safe and beneficial for you to take aspirin daily. Find healthy ways to manage stress, such as: Meditation, yoga, or listening to music. Journaling. Talking to a trusted person. Spending time with friends and family. Minimize exposure to UV radiation to reduce your risk of skin cancer. Safety Always wear your seat belt while driving or riding in a vehicle. Do not drive: If you have been drinking alcohol. Do not ride with someone who has been drinking. When you are tired or distracted. While texting. If you have been using any mind-altering substances or drugs. Wear a helmet and other protective equipment during sports activities. If you have firearms in your house, make sure you follow all gun safety procedures. Seek help if you have been physically or sexually abused. What's next? Visit your health care provider once a year for an annual wellness visit. Ask your health care provider how often you should have your eyes and teeth checked. Stay up to date on all vaccines. This information is not intended to replace advice given to you by your health care provider. Make sure you discuss any questions you have with your health care provider. Document Revised: 01/09/2021 Document Reviewed: 01/09/2021 Elsevier Patient Education  2024 Else[employer] Inc. Breast Self-Awareness Breast self-awareness is knowing how your breasts look and feel. You need to: Check your breasts on a regular basis. Tell your doctor about any changes. Become familiar with the look and feel of your breasts. This can help you catch a breast problem while it is still small and can be treated. You should do breast self-exams even if you have breast implants. What you need: A mirror. A well-lit room. A pillow or other  soft object. How to do a breast self-exam Follow these steps to do a breast self-exam: Look for changes  Take off all the clothes above your waist. Stand in front of a mirror in a room with good lighting. Put your hands down at your sides. Compare your breasts in the mirror. Look for any difference between them, such as: A difference in shape. A difference in size. Wrinkles, dips, and bumps in one breast and not the other. Look at each breast for changes in the skin, such as: Redness. Scaly areas. Skin that has gotten thicker. Dimpling. Open sores (ulcers). Look for changes in your nipples, such as: Fluid coming out of a nipple. Fluid around a nipple. Bleeding. Dimpling. Redness. A nipple that looks pushed in (retracted), or that has changed position. Feel for changes Lie on your back. Feel each breast. To do this: Pick a breast to feel. Place a pillow under the shoulder closest to that breast. Put the arm closest to that breast behind your head. Feel the nipple area of that breast using the hand of your other arm. Feel the area with the pads of your three middle fingers by making small circles with your fingers. Use light, medium, and firm pressure. Continue the overlapping circles, moving downward over the breast. Keep making circles with your fingers. Stop when you feel your ribs. Start making circles with your fingers again, this time going   upward until you reach your collarbone. Then, make circles outward across your breast and into your armpit area. Squeeze your nipple. Check for discharge and lumps. Repeat these steps to check your other breast. Sit or stand in the tub or shower. With soapy water on your skin, feel each breast the same way you did when you were lying down. Write down what you find Writing down what you find can help you remember what to tell your doctor. Write down: What is normal for each breast. Any changes you find in each breast. These  include: The kind of changes you find. A tender or painful breast. Any lump you find. Write down its size and where it is. When you last had your monthly period (menstrual cycle). General tips If you are breastfeeding, the best time to check your breasts is after you feed your baby or after you use a breast pump. If you get monthly bleeding, the best time to check your breasts is 5-7 days after your monthly cycle ends. With time, you will become comfortable with the self-exam. You will also start to know if there are changes in your breasts. Contact a doctor if: You see a change in the shape or size of your breasts or nipples. You see a change in the skin of your breast or nipples, such as red or scaly skin. You have fluid coming from your nipples that is not normal. You find a new lump or thick area. You have breast pain. You have any concerns about your breast health. Summary Breast self-awareness includes looking for changes in your breasts and feeling for changes within your breasts. You should do breast self-awareness in front of a mirror in a well-lit room. If you get monthly periods (menstrual cycles), the best time to check your breasts is 5-7 days after your period ends. Tell your doctor about any changes you see in your breasts. Changes include changes in size, changes on the skin, painful or tender breasts, or fluid from your nipples that is not normal. This information is not intended to replace advice given to you by your health care provider. Make sure you discuss any questions you have with your health care provider. Document Revised: 12/19/2021 Document Reviewed: 05/16/2021 Elsevier Patient Education  2024 Elsevier Inc.  

## 2023-07-02 ENCOUNTER — Other Ambulatory Visit (HOSPITAL_COMMUNITY)
Admission: RE | Admit: 2023-07-02 | Discharge: 2023-07-02 | Disposition: A | Discharge status: Home / Self Care | Payer: BC Managed Care – PPO | Source: Ambulatory Visit | Attending: Obstetrics and Gynecology | Admitting: Obstetrics and Gynecology

## 2023-07-02 ENCOUNTER — Ambulatory Visit (INDEPENDENT_AMBULATORY_CARE_PROVIDER_SITE_OTHER): Payer: BC Managed Care – PPO | Admitting: Obstetrics and Gynecology

## 2023-07-02 ENCOUNTER — Encounter: Payer: Self-pay | Admitting: Obstetrics and Gynecology

## 2023-07-02 VITALS — BP 111/75 | HR 87 | Resp 16 | Ht 67.0 in | Wt 217.6 lb

## 2023-07-02 DIAGNOSIS — E66811 Obesity, class 1: Secondary | ICD-10-CM

## 2023-07-02 DIAGNOSIS — Z13220 Encounter for screening for lipoid disorders: Secondary | ICD-10-CM

## 2023-07-02 DIAGNOSIS — Z1231 Encounter for screening mammogram for malignant neoplasm of breast: Secondary | ICD-10-CM

## 2023-07-02 DIAGNOSIS — Z01419 Encounter for gynecological examination (general) (routine) without abnormal findings: Secondary | ICD-10-CM | POA: Diagnosis not present

## 2023-07-02 DIAGNOSIS — Z124 Encounter for screening for malignant neoplasm of cervix: Secondary | ICD-10-CM

## 2023-07-02 DIAGNOSIS — F32A Depression, unspecified: Secondary | ICD-10-CM

## 2023-07-02 DIAGNOSIS — Z131 Encounter for screening for diabetes mellitus: Secondary | ICD-10-CM

## 2023-07-03 LAB — COMPREHENSIVE METABOLIC PANEL
ALT: 17 [IU]/L (ref 0–32)
AST: 17 [IU]/L (ref 0–40)
Albumin: 4.3 g/dL (ref 3.9–4.9)
Alkaline Phosphatase: 73 [IU]/L (ref 44–121)
BUN/Creatinine Ratio: 15 (ref 9–23)
BUN: 13 mg/dL (ref 6–24)
Bilirubin Total: 0.4 mg/dL (ref 0.0–1.2)
CO2: 22 mmol/L (ref 20–29)
Calcium: 9.4 mg/dL (ref 8.7–10.2)
Chloride: 101 mmol/L (ref 96–106)
Creatinine, Ser: 0.85 mg/dL (ref 0.57–1.00)
Globulin, Total: 2.2 g/dL (ref 1.5–4.5)
Glucose: 95 mg/dL (ref 70–99)
Potassium: 4.7 mmol/L (ref 3.5–5.2)
Sodium: 138 mmol/L (ref 134–144)
Total Protein: 6.5 g/dL (ref 6.0–8.5)
eGFR: 88 mL/min/{1.73_m2} (ref >59)

## 2023-07-03 LAB — LIPID PANEL
Chol/HDL Ratio: 3.7 {ratio} (ref 0.0–4.4)
Cholesterol, Total: 202 mg/dL — ABNORMAL HIGH (ref 100–199)
HDL: 54 mg/dL (ref >39)
LDL Chol Calc (NIH): 129 mg/dL — ABNORMAL HIGH (ref 0–99)
Triglycerides: 107 mg/dL (ref 0–149)
VLDL Cholesterol Cal: 19 mg/dL (ref 5–40)

## 2023-07-03 LAB — CBC
Hematocrit: 46.1 % (ref 34.0–46.6)
Hemoglobin: 15 g/dL (ref 11.1–15.9)
MCH: 30.7 pg (ref 26.6–33.0)
MCHC: 32.5 g/dL (ref 31.5–35.7)
MCV: 95 fL (ref 79–97)
Platelets: 295 10*3/uL (ref 150–450)
RBC: 4.88 x10E6/uL (ref 3.77–5.28)
RDW: 11.9 % (ref 11.7–15.4)
WBC: 7.4 10*3/uL (ref 3.4–10.8)

## 2023-07-03 LAB — HEMOGLOBIN A1C
Est. average glucose Bld gHb Est-mCnc: 111 mg/dL
Hgb A1c MFr Bld: 5.5 % (ref 4.8–5.6)

## 2023-07-07 LAB — CYTOLOGY - PAP
Comment: NEGATIVE
Diagnosis: NEGATIVE
High risk HPV: NEGATIVE

## 2023-08-04 DIAGNOSIS — F411 Generalized anxiety disorder: Secondary | ICD-10-CM | POA: Diagnosis not present

## 2023-08-04 DIAGNOSIS — F422 Mixed obsessional thoughts and acts: Secondary | ICD-10-CM | POA: Diagnosis not present

## 2023-08-04 DIAGNOSIS — F5105 Insomnia due to other mental disorder: Secondary | ICD-10-CM | POA: Diagnosis not present

## 2023-10-28 DIAGNOSIS — F411 Generalized anxiety disorder: Secondary | ICD-10-CM | POA: Diagnosis not present

## 2023-10-28 DIAGNOSIS — F422 Mixed obsessional thoughts and acts: Secondary | ICD-10-CM | POA: Diagnosis not present

## 2023-10-28 DIAGNOSIS — F5105 Insomnia due to other mental disorder: Secondary | ICD-10-CM | POA: Diagnosis not present

## 2024-01-01 ENCOUNTER — Encounter: Payer: Self-pay | Admitting: Family

## 2024-01-01 ENCOUNTER — Telehealth: Payer: Self-pay | Admitting: Family

## 2024-01-01 ENCOUNTER — Ambulatory Visit: Admitting: Family

## 2024-01-01 ENCOUNTER — Other Ambulatory Visit (HOSPITAL_COMMUNITY): Payer: Self-pay

## 2024-01-01 ENCOUNTER — Telehealth: Payer: Self-pay

## 2024-01-01 VITALS — BP 114/76 | HR 97 | Temp 98.2°F | Ht 67.0 in

## 2024-01-01 DIAGNOSIS — R7303 Prediabetes: Secondary | ICD-10-CM | POA: Insufficient documentation

## 2024-01-01 DIAGNOSIS — Z1231 Encounter for screening mammogram for malignant neoplasm of breast: Secondary | ICD-10-CM | POA: Diagnosis not present

## 2024-01-01 DIAGNOSIS — E782 Mixed hyperlipidemia: Secondary | ICD-10-CM | POA: Insufficient documentation

## 2024-01-01 DIAGNOSIS — E66811 Obesity, class 1: Secondary | ICD-10-CM | POA: Diagnosis not present

## 2024-01-01 DIAGNOSIS — Z6834 Body mass index (BMI) 34.0-34.9, adult: Secondary | ICD-10-CM

## 2024-01-01 DIAGNOSIS — Z9889 Other specified postprocedural states: Secondary | ICD-10-CM

## 2024-01-01 DIAGNOSIS — E6609 Other obesity due to excess calories: Secondary | ICD-10-CM | POA: Insufficient documentation

## 2024-01-01 MED ORDER — WEGOVY 0.5 MG/0.5ML ~~LOC~~ SOAJ: SUBCUTANEOUS | 0 refills | Status: AC

## 2024-01-01 NOTE — Telephone Encounter (Signed)
 Pharmacy Patient Advocate Encounter   Received notification from Physician's Office that prior authorization for Wegovy 0.5 is required/requested.   Insurance verification completed.   The patient is insured through [facility] .   Per test claim: patient has plan benefit exclusion for weight loss medications

## 2024-01-01 NOTE — Assessment & Plan Note (Signed)
Pt advised to:  Work on low cholesterol diet and exercise as tolerated

## 2024-01-01 NOTE — Telephone Encounter (Signed)
Can we start prior auth for wegovy?

## 2024-01-01 NOTE — Patient Instructions (Signed)
  I have sent an electronic order over to your preferred location for the following:   []   2D Mammogram  [x]   3D Mammogram  []   Bone Density   Please give this center a call to get scheduled at your convenience.  [x]   [facility]  [address] [place] [address]  [phone]  Make sure to wear two piece  clothing  No lotions powders or deodorants the day of the appointment Make sure to bring picture ID and insurance card.  Bring list of medications you are currently taking including any supplements.   ------------------------------------

## 2024-01-01 NOTE — Progress Notes (Signed)
 New Patient Office Visit  Subjective:  Patient ID: Melanie Stevenson, female    DOB: 1980/12/10  Age: 43 y.o. MRN: [id number]  CC:  Chief Complaint  Patient presents with   Establish Care    HPI Melanie Stevenson is here to establish care as a new patient.  Oriented to practice routines and expectations.  Prior provider was:was really only following with Dr. Denman Fischer.   Pt is without acute concerns.   chronic concerns:  Obesity: From [facility], she is getting compound semaglutide and since starting two months ago her diet is much better without as much snacking and food noise. Choose better options and work on portion control is much easier.  Exercise: boot camp 2 x a week and barre workout once a week Diet: improving. Breakfast, protein smoothie every am.   Anxiety: on Prozac 40 mg which is working really well for her but she did start eating more food and craving food more often which has contributed to her weight gain.       Wt Readings from Last 3 Encounters:  07/02/23 217 lb 9.6 oz (98.7 kg)  04/15/22 197 lb (89.4 kg)  04/12/21 198 lb 11.2 oz (90.1 kg)     ROS: Negative unless specifically indicated above in HPI.   Current Outpatient Medications:    FLUoxetine (PROZAC) 40 MG capsule, Take 40 mg by mouth at bedtime., Disp: , Rfl:    loratadine (CLARITIN) 10 MG tablet, Take 10 mg by mouth daily., Disp: , Rfl:    Prenatal Vit-Fe Fumarate-FA (MULTIVITAMIN-PRENATAL) 27-0.8 MG TABS tablet, Take 1 tablet by mouth daily at 12 noon., Disp: , Rfl:    Probiotic Product (PROBIOTIC COLON SUPPORT) CAPS, Take by mouth., Disp: , Rfl:    [START ON 01/22/2024] Semaglutide-Weight Management (WEGOVY) 0.5 MG/0.5ML SOAJ, Inject 0.5 mg [place] weekly, Disp: 2 mL, Rfl: 0 Past Medical History:  Diagnosis Date   Abnormal Pap smear of cervix    Anxiety    Depression    HPV (human papilloma virus) infection    HSV infection    Past Surgical History:  Procedure Laterality Date    CERVICAL BIOPSY  W/ LOOP ELECTRODE EXCISION     KNEE SURGERY Right     Objective:   Today's Vitals: BP 114/76 (BP Location: Left Arm, Patient Position: Sitting, Cuff Size: Normal)   Pulse 97   Temp 98.2 F (36.8 C) (Temporal)   Ht 5\' 7"  (1.702 m)   LMP 12/27/2023 (Exact Date)   SpO2 99%   BMI 34.08 kg/m   Physical Exam Vitals reviewed.  Constitutional:      General: She is not in acute distress.    Appearance: Normal appearance. She is normal weight. She is not ill-appearing, toxic-appearing or diaphoretic.  HENT:     Head: Normocephalic.  Cardiovascular:     Rate and Rhythm: Normal rate and regular rhythm.  Pulmonary:     Effort: Pulmonary effort is normal.     Breath sounds: Normal breath sounds.  Musculoskeletal:        General: Normal range of motion.  Neurological:     General: No focal deficit present.     Mental Status: She is alert and oriented to person, place, and time. Mental status is at baseline.  Psychiatric:        Mood and Affect: Mood normal.        Behavior: Behavior normal.        Thought Content: Thought content normal.  Judgment: Judgment normal.     Assessment & Plan:  History of loop electrical excision procedure (LEEP)  Mixed hyperlipidemia Assessment & Plan: Pt advised to:  Work on low cholesterol diet and exercise as tolerated   Orders: -     [id number]; Inject 0.5 mg [place] weekly  Dispense: 2 mL; Refill: 0  Class 1 obesity due to excess calories without serious comorbidity with body mass index (BMI) of 34.0 to 34.9 in adult Assessment & Plan: The beneficiary does not have any FDA labeled contraindications to the requested agent including pregnancy, lactation, h/o medullary thyroid  cancer or multiple endocrine neoplasia type II.   Trial start wegovy already on semaglutide compound would rather pt be on brand.  Sending 0.5 mg weekly as she is already on this dose and has responded well.   Orders: -     Wegovy; Inject 0.5 mg [place]  weekly  Dispense: 2 mL; Refill: 0  Screening mammogram for breast cancer -     3D Screening Mammogram, Left and Right; Future  Prediabetes Assessment & Plan: Pt advised of the following: Work on a diabetic diet, try to incorporate exercise at least 20-30 a day for 3 days a week or more.    Orders: -     Wegovy; Inject 0.5 mg [place] weekly  Dispense: 2 mL; Refill: 0  Obesity (BMI 30.0-34.9) Assessment & Plan: The beneficiary does not have any FDA labeled contraindications to the requested agent including pregnancy, lactation, h/o medullary thyroid  cancer or multiple endocrine neoplasia type II.   Trial start wegovy already on semaglutide compound would rather pt be on brand.  Sending 0.5 mg weekly as she is already on this dose and has responded well.       Follow-up: Return in about 6 months (around 07/02/2024) for f/u CPE.   Felicita Horns, FNP

## 2024-01-01 NOTE — Assessment & Plan Note (Signed)
 The beneficiary does not have any FDA labeled contraindications to the requested agent including pregnancy, lactation, h/o medullary thyroid  cancer or multiple endocrine neoplasia type II.   Trial start wegovy already on semaglutide compound would rather pt be on brand.  Sending 0.5 mg weekly as she is already on this dose and has responded well.

## 2024-01-01 NOTE — Assessment & Plan Note (Signed)
 Pt advised of the following: Work on a diabetic diet, try to incorporate exercise at least 20-30 a day for 3 days a week or more.

## 2024-01-08 ENCOUNTER — Ambulatory Visit
Admission: RE | Admit: 2024-01-08 | Discharge: 2024-01-08 | Disposition: A | Discharge status: Home / Self Care | Source: Ambulatory Visit | Attending: Family | Admitting: Family

## 2024-01-08 DIAGNOSIS — Z1231 Encounter for screening mammogram for malignant neoplasm of breast: Secondary | ICD-10-CM | POA: Insufficient documentation

## 2024-01-12 ENCOUNTER — Ambulatory Visit: Payer: Self-pay | Admitting: Family

## 2024-01-27 DIAGNOSIS — F422 Mixed obsessional thoughts and acts: Secondary | ICD-10-CM | POA: Diagnosis not present

## 2024-01-27 DIAGNOSIS — F5105 Insomnia due to other mental disorder: Secondary | ICD-10-CM | POA: Diagnosis not present

## 2024-01-27 DIAGNOSIS — F411 Generalized anxiety disorder: Secondary | ICD-10-CM | POA: Diagnosis not present

## 2024-03-02 DIAGNOSIS — F411 Generalized anxiety disorder: Secondary | ICD-10-CM | POA: Diagnosis not present

## 2024-03-02 DIAGNOSIS — Z79899 Other long term (current) drug therapy: Secondary | ICD-10-CM | POA: Diagnosis not present

## 2024-03-02 DIAGNOSIS — F422 Mixed obsessional thoughts and acts: Secondary | ICD-10-CM | POA: Diagnosis not present

## 2024-03-02 DIAGNOSIS — F5105 Insomnia due to other mental disorder: Secondary | ICD-10-CM | POA: Diagnosis not present

## 2024-05-24 DIAGNOSIS — F422 Mixed obsessional thoughts and acts: Secondary | ICD-10-CM | POA: Diagnosis not present

## 2024-05-24 DIAGNOSIS — F411 Generalized anxiety disorder: Secondary | ICD-10-CM | POA: Diagnosis not present

## 2024-05-24 DIAGNOSIS — F5105 Insomnia due to other mental disorder: Secondary | ICD-10-CM | POA: Diagnosis not present

## 2024-06-21 DIAGNOSIS — L578 Other skin changes due to chronic exposure to nonionizing radiation: Secondary | ICD-10-CM | POA: Diagnosis not present

## 2024-06-21 DIAGNOSIS — Z86018 Personal history of other benign neoplasm: Secondary | ICD-10-CM | POA: Diagnosis not present

## 2024-06-21 DIAGNOSIS — Z872 Personal history of diseases of the skin and subcutaneous tissue: Secondary | ICD-10-CM | POA: Diagnosis not present

## 2024-07-08 ENCOUNTER — Ambulatory Visit: Admitting: Family

## 2024-07-08 ENCOUNTER — Encounter: Payer: Self-pay | Admitting: Family

## 2024-07-08 VITALS — BP 112/72 | HR 99 | Temp 98.0°F | Ht 67.0 in | Wt 195.0 lb

## 2024-07-08 DIAGNOSIS — Z87898 Personal history of other specified conditions: Secondary | ICD-10-CM | POA: Diagnosis not present

## 2024-07-08 DIAGNOSIS — Z1231 Encounter for screening mammogram for malignant neoplasm of breast: Secondary | ICD-10-CM

## 2024-07-08 DIAGNOSIS — F419 Anxiety disorder, unspecified: Secondary | ICD-10-CM | POA: Insufficient documentation

## 2024-07-08 DIAGNOSIS — Z0000 Encounter for general adult medical examination without abnormal findings: Secondary | ICD-10-CM | POA: Insufficient documentation

## 2024-07-08 DIAGNOSIS — Z23 Encounter for immunization: Secondary | ICD-10-CM | POA: Diagnosis not present

## 2024-07-08 DIAGNOSIS — E782 Mixed hyperlipidemia: Secondary | ICD-10-CM | POA: Diagnosis not present

## 2024-07-08 LAB — COMPREHENSIVE METABOLIC PANEL WITH GFR
ALT: 20 U/L (ref 0–35)
AST: 16 U/L (ref 0–37)
Albumin: 4.2 g/dL (ref 3.5–5.2)
Alkaline Phosphatase: 58 U/L (ref 39–117)
BUN: 15 mg/dL (ref 6–23)
CO2: 28 meq/L (ref 19–32)
Calcium: 8.9 mg/dL (ref 8.4–10.5)
Chloride: 103 meq/L (ref 96–112)
Creatinine, Ser: 0.86 mg/dL (ref 0.40–1.20)
GFR: 83.18 mL/min (ref >60.00)
Glucose, Bld: 71 mg/dL (ref 70–99)
Potassium: 4.3 meq/L (ref 3.5–5.1)
Sodium: 137 meq/L (ref 135–145)
Total Bilirubin: 0.5 mg/dL (ref 0.2–1.2)
Total Protein: 6.4 g/dL (ref 6.0–8.3)

## 2024-07-08 LAB — CBC
HCT: 42.4 % (ref 36.0–46.0)
Hemoglobin: 14.4 g/dL (ref 12.0–15.0)
MCHC: 34.1 g/dL (ref 30.0–36.0)
MCV: 90.7 fl (ref 78.0–100.0)
Platelets: 274 K/uL (ref 150.0–400.0)
RBC: 4.67 Mil/uL (ref 3.87–5.11)
RDW: 12.6 % (ref 11.5–15.5)
WBC: 5.7 K/uL (ref 4.0–10.5)

## 2024-07-08 LAB — HEMOGLOBIN A1C: Hgb A1c MFr Bld: 5 % (ref 4.6–6.5)

## 2024-07-08 LAB — LIPID PANEL
Cholesterol: 168 mg/dL (ref 0–200)
HDL: 51.5 mg/dL (ref >39.00)
LDL Cholesterol: 102 mg/dL — ABNORMAL HIGH (ref 0–99)
NonHDL: 116.7
Total CHOL/HDL Ratio: 3
Triglycerides: 73 mg/dL (ref 0.0–149.0)
VLDL: 14.6 mg/dL (ref 0.0–40.0)

## 2024-07-08 LAB — TSH: TSH: 1.85 u[IU]/mL (ref 0.35–5.50)

## 2024-07-08 MED ORDER — FLUOXETINE HCL 40 MG PO CAPS: 40.0000 mg | ORAL_CAPSULE | Freq: Every day | ORAL | 3 refills | Status: AC

## 2024-07-08 NOTE — Progress Notes (Signed)
 Subjective:  Patient ID: Melanie Stevenson, female    DOB: [birth year]  Age: 43 y.o. MRN: [id number]  Patient Care Team: Corwin Antu, FNP as PCP - General (Family Medicine)   CC:  Chief Complaint  Patient presents with   Annual Exam    HPI Melanie Stevenson is a 43 y.o. female who presents today for an annual physical exam. She reports consuming a general diet. Is working on a routine, boot camp two mornings a week and barre class one morning a week She generally feels well. She reports sleeping well. She does not have additional problems to discuss today.   Vision:Not within last year Dental:Receives regular dental care  Mammogram: 01/08/24 annual  Last pap: 07/02/23  Pt is without acute concerns.   Discussed the use of AI scribe software for clinical note transcription with the patient, who gave verbal consent to proceed.  History of Present Illness Melanie Stevenson is a 43 year old female who presents for an annual physical exam.  She is currently using semaglutide  for weight management, obtained from a medical salon in [place], costing approximately $100 per week. She is not on a full dose and is unsure of the exact dosage. She has experienced a significant reduction in 'food noise' and no longer experiences 'shaky, hungry' feelings. During a one-month break from the medication, she noticed an increase in hunger.  She is taking Prozac 40 mg for anxiety, which she would like managed by her current provider. The medication is effective, with occasional anxiety episodes that resolve quickly. She also takes a prenatal vitamin, a probiotic, and Claritin.  She has a history of HPV and is interested in receiving the HPV vaccine, which she has not had before. She is concerned about the risk of cervical cancer and prefers to have annual Pap smears.   Advanced Directives Patient does not have advanced directives   DEPRESSION SCREENING    07/08/2024    8:38 AM 01/01/2024    9:21 AM  04/15/2022   11:46 AM 04/12/2021    2:14 PM  PHQ 2/9 Scores  PHQ - 2 Score 0 0 0 1  PHQ- 9 Score 2 2  0  2      Data saved with a previous flowsheet row definition     ROS: Negative unless specifically indicated above in HPI.   Current Medications[1]    Objective:    BP 112/72 (BP Location: Left Arm, Patient Position: Sitting, Cuff Size: Large)   Pulse 99   Temp 98 F (36.7 C) (Temporal)   Ht 5' 7 (1.702 m)   Wt 195 lb (88.5 kg)   LMP 07/07/2024 (Exact Date)   SpO2 98%   BMI 30.54 kg/m     Physical Exam Vitals reviewed.  Constitutional:      General: She is not in acute distress.    Appearance: Normal appearance. She is normal weight. She is not ill-appearing.  HENT:     Head: Normocephalic.     Right Ear: Tympanic membrane normal.     Left Ear: Tympanic membrane normal.     Nose: Nose normal.     Mouth/Throat:     Mouth: Mucous membranes are moist.  Eyes:     Extraocular Movements: Extraocular movements intact.     Pupils: Pupils are equal, round, and reactive to light.  Cardiovascular:     Rate and Rhythm: Normal rate and regular rhythm.  Pulmonary:     Effort: Pulmonary effort is normal.  Breath sounds: Normal breath sounds.  Abdominal:     General: Abdomen is flat. Bowel sounds are normal.     Palpations: Abdomen is soft.     Tenderness: There is no guarding or rebound.  Musculoskeletal:        General: Normal range of motion.     Cervical back: Normal range of motion.  Skin:    General: Skin is warm.     Capillary Refill: Capillary refill takes less than 2 seconds.  Neurological:     General: No focal deficit present.     Mental Status: She is alert.  Psychiatric:        Mood and Affect: Mood normal.        Behavior: Behavior normal.        Thought Content: Thought content normal.        Judgment: Judgment normal.       Results       Assessment & Plan:   Assessment and Plan Assessment & Plan Obesity Currently on semaglutide   for weight management, sourced from a medical salon in [place]. Reports significant improvement in hunger control and overall well-being. Aware of potential dosing errors and lack of FDA regulation from the source. Interested in future oral formulations of GLP-1 receptor agonists. - Continue semaglutide  as prescribed. - Monitor for potential dosing errors and ensure proper storage. - Stay informed about new oral formulations of GLP-1 receptor agonists.  Mixed hyperlipidemia Family history of cardiovascular disease, including mother's recent heart attack and grandmother's diabetes. Monitoring cholesterol levels as part of cardiovascular risk management. - Continue monitoring cholesterol levels.  Prediabetes Family history of diabetes. Monitoring A1c levels to manage prediabetes risk. - Ordered A1c test to monitor prediabetes status.  Anxiety Well-managed on fluoxetine 40 mg. Reports occasional anxiety flare-ups but overall stability. Transitioning management of fluoxetine to primary care provider. - Continue fluoxetine 40 mg daily. I am going to take this over. - Transitioned management of fluoxetine to primary care provider.  General Health Maintenance Due for mammogram after June 13th, 2026. Discussed HPV vaccination series due to history of HPV infection and desire for continued protection against high-risk strains. - Ordered mammogram to be scheduled after June 13th, 2026. - Scheduled second HPV vaccination in two months and third in six months.   Patient Counseling(The following topics were reviewed):  Preventative care handout given to pt  Health maintenance and immunizations reviewed. Please refer to Health maintenance section. Pt advised on safe sex, wearing seatbelts in car, and proper nutrition labwork ordered today for annual Dental health: Discussed importance of regular tooth brushing, flossing, and dental visits.    Follow-up: Return in about 1 year (around  07/08/2025) for f/u CPE.   Ginger Patrick, FNP     [1]  Current Outpatient Medications:    loratadine (CLARITIN) 10 MG tablet, Take 10 mg by mouth daily., Disp: , Rfl:    Prenatal Vit-Fe Fumarate-FA (MULTIVITAMIN-PRENATAL) 27-0.8 MG TABS tablet, Take 1 tablet by mouth daily at 12 noon., Disp: , Rfl:    Probiotic Product (PROBIOTIC COLON SUPPORT) CAPS, Take by mouth., Disp: , Rfl:    Semaglutide -Weight Management (WEGOVY ) 0.5 MG/0.5ML SOAJ, Inject 0.5 mg [place] weekly, Disp: 2 mL, Rfl: 0   FLUoxetine (PROZAC) 40 MG capsule, Take 1 capsule (40 mg total) by mouth at bedtime., Disp: 90 capsule, Rfl: 3

## 2024-07-11 ENCOUNTER — Ambulatory Visit: Payer: Self-pay | Admitting: Family

## 2024-09-08 ENCOUNTER — Ambulatory Visit

## 2024-12-07 ENCOUNTER — Ambulatory Visit

## 2025-07-11 ENCOUNTER — Encounter: Admitting: Family
